# Patient Record
Sex: Female | Born: 1960 | Race: White | Hispanic: No | Marital: Married | Smoking: Current every day smoker
Health system: Southern US, Community
[De-identification: ages and names within clinical notes are randomized; demographics above are authoritative.]

## PROBLEM LIST (undated history)

## (undated) DIAGNOSIS — R51 Headache: Secondary | ICD-10-CM

## (undated) DIAGNOSIS — M797 Fibromyalgia: Secondary | ICD-10-CM

## (undated) DIAGNOSIS — C801 Malignant (primary) neoplasm, unspecified: Secondary | ICD-10-CM

## (undated) DIAGNOSIS — M130 Polyarthritis, unspecified: Secondary | ICD-10-CM

## (undated) DIAGNOSIS — N200 Calculus of kidney: Secondary | ICD-10-CM

## (undated) DIAGNOSIS — I10 Essential (primary) hypertension: Secondary | ICD-10-CM

## (undated) DIAGNOSIS — L930 Discoid lupus erythematosus: Secondary | ICD-10-CM

## (undated) HISTORY — PX: OTHER SURGICAL HISTORY: SHX169

## (undated) HISTORY — PX: EYE SURGERY: SHX253

## (undated) HISTORY — DX: Headache: R51

## (undated) HISTORY — DX: Polyarthritis, unspecified: M13.0

## (undated) HISTORY — PX: CHOLECYSTECTOMY: SHX55

## (undated) HISTORY — DX: Discoid lupus erythematosus: L93.0

## (undated) HISTORY — PX: NECK SURGERY: SHX720

## (undated) HISTORY — PX: APPENDECTOMY: SHX54

## (undated) HISTORY — PX: ABDOMINAL HYSTERECTOMY: SHX81

## (undated) HISTORY — DX: Fibromyalgia: M79.7

---

## 1999-01-30 ENCOUNTER — Inpatient Hospital Stay (HOSPITAL_COMMUNITY): Admission: EM | Admit: 1999-01-30 | Discharge: 1999-01-31 | Payer: Self-pay | Admitting: Emergency Medicine

## 1999-01-31 ENCOUNTER — Encounter: Payer: Self-pay | Admitting: Cardiology

## 2001-03-26 ENCOUNTER — Encounter (INDEPENDENT_AMBULATORY_CARE_PROVIDER_SITE_OTHER): Payer: Self-pay | Admitting: Internal Medicine

## 2001-03-26 ENCOUNTER — Ambulatory Visit (HOSPITAL_COMMUNITY): Admission: RE | Admit: 2001-03-26 | Discharge: 2001-03-26 | Payer: Self-pay | Admitting: Internal Medicine

## 2002-02-08 ENCOUNTER — Encounter: Payer: Self-pay | Admitting: Family Medicine

## 2002-02-08 ENCOUNTER — Ambulatory Visit (HOSPITAL_COMMUNITY): Admission: RE | Admit: 2002-02-08 | Discharge: 2002-02-08 | Payer: Self-pay | Admitting: Family Medicine

## 2002-04-29 ENCOUNTER — Ambulatory Visit (HOSPITAL_COMMUNITY): Admission: RE | Admit: 2002-04-29 | Discharge: 2002-04-29 | Payer: Self-pay | Admitting: Internal Medicine

## 2002-05-22 ENCOUNTER — Emergency Department (HOSPITAL_COMMUNITY): Admission: EM | Admit: 2002-05-22 | Discharge: 2002-05-23 | Payer: Self-pay | Admitting: Emergency Medicine

## 2002-05-23 ENCOUNTER — Encounter: Payer: Self-pay | Admitting: Emergency Medicine

## 2002-05-30 ENCOUNTER — Ambulatory Visit (HOSPITAL_COMMUNITY): Admission: RE | Admit: 2002-05-30 | Discharge: 2002-05-30 | Payer: Self-pay | Admitting: Internal Medicine

## 2002-05-30 ENCOUNTER — Encounter (INDEPENDENT_AMBULATORY_CARE_PROVIDER_SITE_OTHER): Payer: Self-pay | Admitting: Internal Medicine

## 2002-07-08 ENCOUNTER — Ambulatory Visit (HOSPITAL_COMMUNITY): Admission: RE | Admit: 2002-07-08 | Discharge: 2002-07-08 | Payer: Self-pay | Admitting: Family Medicine

## 2002-07-08 ENCOUNTER — Encounter: Payer: Self-pay | Admitting: Family Medicine

## 2002-09-02 ENCOUNTER — Encounter: Payer: Self-pay | Admitting: Emergency Medicine

## 2002-09-02 ENCOUNTER — Emergency Department (HOSPITAL_COMMUNITY): Admission: EM | Admit: 2002-09-02 | Discharge: 2002-09-02 | Payer: Self-pay | Admitting: Emergency Medicine

## 2003-02-07 ENCOUNTER — Ambulatory Visit (HOSPITAL_COMMUNITY): Admission: RE | Admit: 2003-02-07 | Discharge: 2003-02-07 | Payer: Self-pay | Admitting: Family Medicine

## 2003-02-07 ENCOUNTER — Encounter: Payer: Self-pay | Admitting: Family Medicine

## 2003-04-24 ENCOUNTER — Ambulatory Visit (HOSPITAL_COMMUNITY): Admission: RE | Admit: 2003-04-24 | Discharge: 2003-04-24 | Payer: Self-pay | Admitting: *Deleted

## 2003-04-26 ENCOUNTER — Encounter (HOSPITAL_COMMUNITY): Admission: RE | Admit: 2003-04-26 | Discharge: 2003-05-26 | Payer: Self-pay | Admitting: Cardiology

## 2003-04-26 ENCOUNTER — Encounter: Payer: Self-pay | Admitting: Cardiology

## 2004-07-06 ENCOUNTER — Emergency Department (HOSPITAL_COMMUNITY): Admission: EM | Admit: 2004-07-06 | Discharge: 2004-07-07 | Payer: Self-pay | Admitting: Emergency Medicine

## 2004-08-29 ENCOUNTER — Ambulatory Visit (HOSPITAL_COMMUNITY): Admission: RE | Admit: 2004-08-29 | Discharge: 2004-08-29 | Payer: Self-pay | Admitting: Family Medicine

## 2004-12-04 ENCOUNTER — Ambulatory Visit: Payer: Self-pay | Admitting: Psychology

## 2004-12-18 ENCOUNTER — Ambulatory Visit (HOSPITAL_COMMUNITY): Admission: RE | Admit: 2004-12-18 | Discharge: 2004-12-18 | Payer: Self-pay | Admitting: Family Medicine

## 2005-01-30 ENCOUNTER — Ambulatory Visit: Payer: Self-pay | Admitting: Psychology

## 2005-01-31 ENCOUNTER — Ambulatory Visit (HOSPITAL_BASED_OUTPATIENT_CLINIC_OR_DEPARTMENT_OTHER): Admission: RE | Admit: 2005-01-31 | Discharge: 2005-01-31 | Payer: Self-pay | Admitting: Ophthalmology

## 2005-01-31 ENCOUNTER — Ambulatory Visit (HOSPITAL_COMMUNITY): Admission: RE | Admit: 2005-01-31 | Discharge: 2005-01-31 | Payer: Self-pay | Admitting: Ophthalmology

## 2005-04-11 ENCOUNTER — Ambulatory Visit (HOSPITAL_COMMUNITY): Admission: RE | Admit: 2005-04-11 | Discharge: 2005-04-11 | Payer: Self-pay | Admitting: Family Medicine

## 2005-05-05 ENCOUNTER — Ambulatory Visit: Payer: Self-pay | Admitting: Psychology

## 2005-05-06 ENCOUNTER — Ambulatory Visit: Payer: Self-pay | Admitting: Gastroenterology

## 2005-05-12 ENCOUNTER — Ambulatory Visit: Payer: Self-pay | Admitting: Gastroenterology

## 2005-05-12 ENCOUNTER — Encounter (INDEPENDENT_AMBULATORY_CARE_PROVIDER_SITE_OTHER): Payer: Self-pay | Admitting: Specialist

## 2005-05-15 ENCOUNTER — Ambulatory Visit (HOSPITAL_COMMUNITY): Admission: RE | Admit: 2005-05-15 | Discharge: 2005-05-15 | Payer: Self-pay | Admitting: Gastroenterology

## 2005-05-22 ENCOUNTER — Ambulatory Visit: Payer: Self-pay | Admitting: Gastroenterology

## 2005-05-28 ENCOUNTER — Ambulatory Visit (HOSPITAL_COMMUNITY): Admission: RE | Admit: 2005-05-28 | Discharge: 2005-05-28 | Payer: Self-pay | Admitting: Family Medicine

## 2005-06-24 ENCOUNTER — Ambulatory Visit: Payer: Self-pay | Admitting: Gastroenterology

## 2005-08-14 ENCOUNTER — Ambulatory Visit: Payer: Self-pay | Admitting: Psychology

## 2005-10-02 ENCOUNTER — Ambulatory Visit (HOSPITAL_COMMUNITY): Admission: RE | Admit: 2005-10-02 | Discharge: 2005-10-02 | Payer: Self-pay | Admitting: *Deleted

## 2006-09-11 ENCOUNTER — Ambulatory Visit: Admission: RE | Admit: 2006-09-11 | Discharge: 2006-09-11 | Payer: Self-pay | Admitting: Family Medicine

## 2006-10-01 ENCOUNTER — Ambulatory Visit: Payer: Self-pay | Admitting: Pulmonary Disease

## 2006-12-11 ENCOUNTER — Ambulatory Visit (HOSPITAL_COMMUNITY): Admission: RE | Admit: 2006-12-11 | Discharge: 2006-12-11 | Payer: Self-pay | Admitting: Family Medicine

## 2008-06-27 ENCOUNTER — Encounter: Payer: Self-pay | Admitting: Gastroenterology

## 2008-06-29 ENCOUNTER — Ambulatory Visit (HOSPITAL_COMMUNITY): Admission: RE | Admit: 2008-06-29 | Discharge: 2008-06-29 | Payer: Self-pay | Admitting: Family Medicine

## 2008-08-14 DIAGNOSIS — Z87442 Personal history of urinary calculi: Secondary | ICD-10-CM | POA: Insufficient documentation

## 2008-08-14 DIAGNOSIS — I10 Essential (primary) hypertension: Secondary | ICD-10-CM | POA: Insufficient documentation

## 2008-08-14 DIAGNOSIS — F329 Major depressive disorder, single episode, unspecified: Secondary | ICD-10-CM | POA: Insufficient documentation

## 2008-08-14 DIAGNOSIS — R519 Headache, unspecified: Secondary | ICD-10-CM | POA: Insufficient documentation

## 2008-08-14 DIAGNOSIS — Z860100 Personal history of colon polyps, unspecified: Secondary | ICD-10-CM | POA: Insufficient documentation

## 2008-08-14 DIAGNOSIS — F411 Generalized anxiety disorder: Secondary | ICD-10-CM | POA: Insufficient documentation

## 2008-08-14 DIAGNOSIS — R197 Diarrhea, unspecified: Secondary | ICD-10-CM | POA: Insufficient documentation

## 2008-08-14 DIAGNOSIS — R51 Headache: Secondary | ICD-10-CM | POA: Insufficient documentation

## 2008-08-14 DIAGNOSIS — M129 Arthropathy, unspecified: Secondary | ICD-10-CM | POA: Insufficient documentation

## 2008-08-15 ENCOUNTER — Ambulatory Visit: Payer: Self-pay | Admitting: Gastroenterology

## 2008-08-15 DIAGNOSIS — K219 Gastro-esophageal reflux disease without esophagitis: Secondary | ICD-10-CM | POA: Insufficient documentation

## 2008-08-15 DIAGNOSIS — L930 Discoid lupus erythematosus: Secondary | ICD-10-CM

## 2008-08-16 ENCOUNTER — Telehealth: Payer: Self-pay | Admitting: Gastroenterology

## 2008-08-21 ENCOUNTER — Encounter: Payer: Self-pay | Admitting: Gastroenterology

## 2008-08-21 ENCOUNTER — Ambulatory Visit: Payer: Self-pay | Admitting: Gastroenterology

## 2008-08-21 LAB — CONVERTED CEMR LAB
AST: 27 units/L (ref 0–37)
Albumin: 4.1 g/dL (ref 3.5–5.2)
Basophils Relative: 0 % (ref 0.0–3.0)
Bilirubin Urine: NEGATIVE
Bilirubin, Direct: 0.1 mg/dL (ref 0.0–0.3)
Calcium: 9.1 mg/dL (ref 8.4–10.5)
Crystals: NEGATIVE
Eosinophils Absolute: 0.2 10*3/uL (ref 0.0–0.7)
Eosinophils Relative: 2.3 % (ref 0.0–5.0)
Ferritin: 144.4 ng/mL (ref 10.0–291.0)
Folate: >20 ng/mL
Hemoglobin, Urine: NEGATIVE
Ketones, ur: 15 mg/dL — AB
Leukocytes, UA: NEGATIVE
MCV: 93.1 fL (ref 78.0–100.0)
Mucus, UA: NEGATIVE
Neutrophils Relative %: 63.2 % (ref 43.0–77.0)
Platelets: 172 10*3/uL (ref 150–400)
Potassium: 4.2 meq/L (ref 3.5–5.1)
Saturation Ratios: 21.4 % (ref 20.0–50.0)
Sodium: 147 meq/L — ABNORMAL HIGH (ref 135–145)
Specific Gravity, Urine: >=1.03 (ref 1.000–1.03)
TSH: 0.81 microintl units/mL (ref 0.35–5.50)
Total Bilirubin: 1 mg/dL (ref 0.3–1.2)
Urine Glucose: NEGATIVE mg/dL
Urobilinogen, UA: 0.2 (ref 0.0–1.0)
WBC: 8.2 10*3/uL (ref 4.5–10.5)

## 2008-08-22 ENCOUNTER — Telehealth: Payer: Self-pay | Admitting: Gastroenterology

## 2008-08-23 ENCOUNTER — Encounter: Payer: Self-pay | Admitting: Gastroenterology

## 2008-08-25 ENCOUNTER — Ambulatory Visit: Payer: Self-pay | Admitting: Gastroenterology

## 2008-08-25 LAB — CONVERTED CEMR LAB: UREASE: NEGATIVE

## 2008-09-19 ENCOUNTER — Ambulatory Visit: Payer: Self-pay | Admitting: Gastroenterology

## 2008-09-20 ENCOUNTER — Telehealth: Payer: Self-pay | Admitting: Gastroenterology

## 2009-12-03 ENCOUNTER — Emergency Department (HOSPITAL_COMMUNITY): Admission: EM | Admit: 2009-12-03 | Discharge: 2009-12-03 | Payer: Self-pay | Admitting: Emergency Medicine

## 2010-10-08 ENCOUNTER — Emergency Department (HOSPITAL_COMMUNITY)
Admission: EM | Admit: 2010-10-08 | Discharge: 2010-10-09 | Payer: Self-pay | Source: Home / Self Care | Admitting: Emergency Medicine

## 2010-11-01 ENCOUNTER — Ambulatory Visit (HOSPITAL_COMMUNITY)
Admission: RE | Admit: 2010-11-01 | Discharge: 2010-11-01 | Payer: Self-pay | Source: Home / Self Care | Attending: Family Medicine | Admitting: Family Medicine

## 2010-11-03 ENCOUNTER — Encounter: Payer: Self-pay | Admitting: Family Medicine

## 2010-11-08 ENCOUNTER — Ambulatory Visit (HOSPITAL_COMMUNITY)
Admission: RE | Admit: 2010-11-08 | Discharge: 2010-11-08 | Payer: Self-pay | Source: Home / Self Care | Attending: Family Medicine | Admitting: Family Medicine

## 2010-11-18 ENCOUNTER — Other Ambulatory Visit (HOSPITAL_COMMUNITY): Payer: Self-pay | Admitting: Family Medicine

## 2010-11-18 DIAGNOSIS — R599 Enlarged lymph nodes, unspecified: Secondary | ICD-10-CM

## 2010-11-25 ENCOUNTER — Ambulatory Visit (HOSPITAL_COMMUNITY)
Admission: RE | Admit: 2010-11-25 | Discharge: 2010-11-25 | Disposition: A | Discharge status: Home / Self Care | Payer: Medicare Other | Source: Ambulatory Visit | Attending: Family Medicine | Admitting: Family Medicine

## 2010-11-25 DIAGNOSIS — R599 Enlarged lymph nodes, unspecified: Secondary | ICD-10-CM | POA: Insufficient documentation

## 2010-11-25 MED ORDER — IOHEXOL 300 MG/ML  SOLN
75.0000 mL | Freq: Once | INTRAMUSCULAR | Status: AC | PRN
  Administered 2010-11-25: 75 mL via INTRAVENOUS

## 2010-12-09 ENCOUNTER — Other Ambulatory Visit: Payer: Self-pay | Admitting: Neurosurgery

## 2010-12-09 DIAGNOSIS — M47812 Spondylosis without myelopathy or radiculopathy, cervical region: Secondary | ICD-10-CM

## 2010-12-10 ENCOUNTER — Ambulatory Visit
Admission: RE | Admit: 2010-12-10 | Discharge: 2010-12-10 | Disposition: A | Discharge status: Home / Self Care | Payer: Medicare Other | Source: Ambulatory Visit | Attending: Neurosurgery | Admitting: Neurosurgery

## 2010-12-10 DIAGNOSIS — M47812 Spondylosis without myelopathy or radiculopathy, cervical region: Secondary | ICD-10-CM

## 2010-12-12 ENCOUNTER — Ambulatory Visit (INDEPENDENT_AMBULATORY_CARE_PROVIDER_SITE_OTHER): Payer: Medicare Other | Admitting: Otolaryngology

## 2010-12-12 DIAGNOSIS — D487 Neoplasm of uncertain behavior of other specified sites: Secondary | ICD-10-CM

## 2010-12-12 DIAGNOSIS — R220 Localized swelling, mass and lump, head: Secondary | ICD-10-CM

## 2011-01-01 LAB — URINALYSIS, ROUTINE W REFLEX MICROSCOPIC
Hgb urine dipstick: NEGATIVE
Ketones, ur: NEGATIVE mg/dL
Nitrite: NEGATIVE
Specific Gravity, Urine: >1.03 — ABNORMAL HIGH (ref 1.005–1.030)

## 2011-01-01 LAB — COMPREHENSIVE METABOLIC PANEL
ALT: 23 U/L (ref 0–35)
Alkaline Phosphatase: 98 U/L (ref 39–117)
BUN: 8 mg/dL (ref 6–23)
CO2: 24 mEq/L (ref 19–32)
Chloride: 105 mEq/L (ref 96–112)
Creatinine, Ser: 0.57 mg/dL (ref 0.4–1.2)
Potassium: 3.7 mEq/L (ref 3.5–5.1)
Sodium: 139 mEq/L (ref 135–145)
Total Bilirubin: 0.4 mg/dL (ref 0.3–1.2)

## 2011-01-01 LAB — CBC
Hemoglobin: 14.7 g/dL (ref 12.0–15.0)
MCHC: 34 g/dL (ref 30.0–36.0)

## 2011-01-01 LAB — DIFFERENTIAL
Basophils Absolute: 0 10*3/uL (ref 0.0–0.1)
Eosinophils Relative: 1 % (ref 0–5)
Lymphocytes Relative: 18 % (ref 12–46)
Monocytes Absolute: 1 10*3/uL (ref 0.1–1.0)
Monocytes Relative: 7 % (ref 3–12)
Neutrophils Relative %: 74 % (ref 43–77)

## 2011-01-17 IMAGING — CR DG CHEST 2V
2 series · 2 of 2 positions shown · non-contrast
Comparison: 10/02/2005

CLINICAL DATA: Question of meningitis.  Headache, fever, neck pain
since last night.

CHEST - 2 VIEW

[view not recorded (1 of 2)]
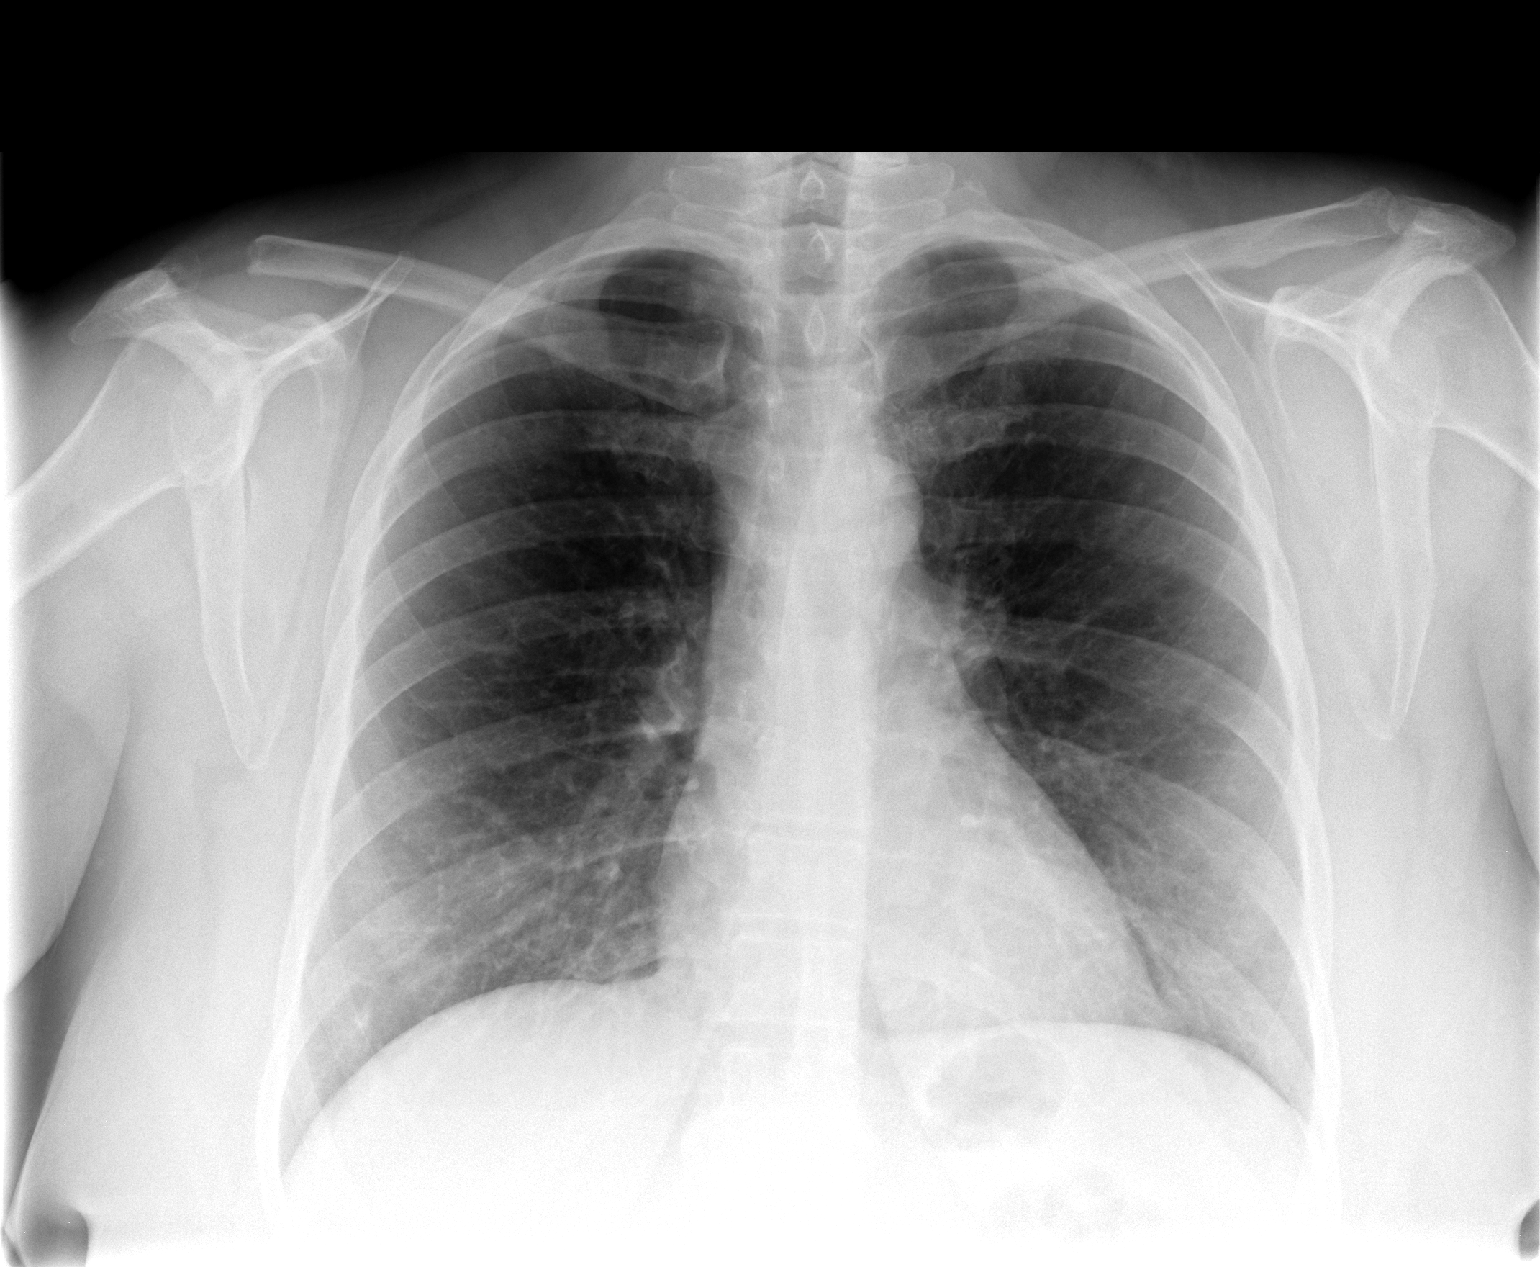

[view not recorded (2 of 2)]
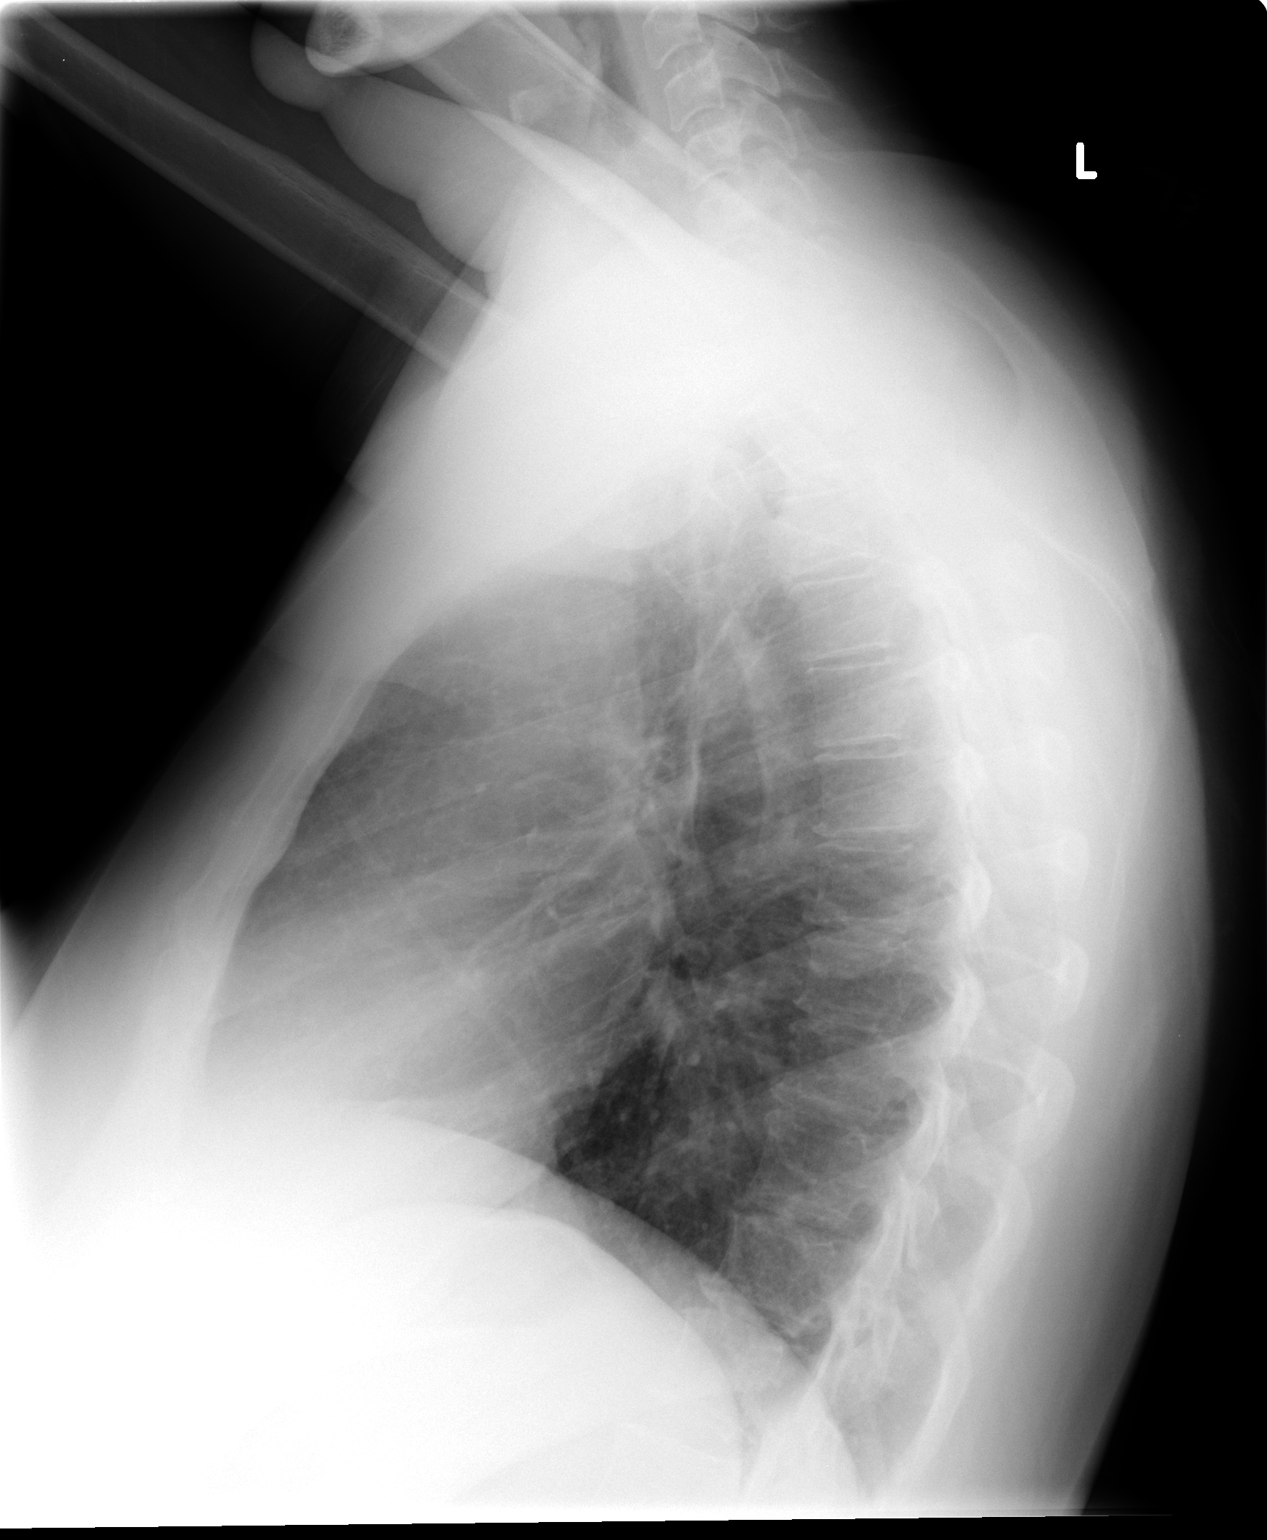

[2 of 2 positions shown; findings below may reference images not displayed]

FINDINGS: Cardiac size is within normal limits.  No focal
consolidations or pleural effusions.  No pulmonary edema.  Mildly
prominent interstitial markings appears stable.  Degenerative
changes are seen in the spine. Patient has had prior resection
arthroplasty of the right acromioclavicular joint.
IMPRESSION: No evidence for acute  abnormality.

## 2011-02-28 NOTE — Op Note (Signed)
NAMEWILLINE, SCHWALBE                ACCOUNT NO.:  [id number]   MEDICAL RECORD NO.:  [id number]          PATIENT TYPE:  AMB   LOCATION:  DSC                          FACILITY:  [facility]   PHYSICIAN:  Pasty Spillers. Maple Hudson, M.D. DATE OF BIRTH:  01/09/1961   DATE OF PROCEDURE:  01/31/2005  DATE OF DISCHARGE:                                 OPERATIVE REPORT   PREOPERATIVE DIAGNOSES:  1.  V-pattern exotropia.  2.  Nystagmus.  3.  Inferior oblique overaction, right greater than left.  4.  Previous surgery in childhood for esotropia, details unknown.   POSTOPERATIVE DIAGNOSES:  1.  V-pattern exotropia.  2.  Nystagmus.  3.  Inferior oblique overaction, right greater than left.  4.  Previous surgery in childhood for esotropia, details unknown.   PROCEDURE:  1.  Explore/advance right medial rectus muscle, 5.5 mm.  2.  Right lateral rectus muscle recession, 6.0 mm.  3.  Right inferior oblique muscle anterior transposition.  4.  Left inferior oblique muscle recession.   SURGEON:  Pasty Spillers. Maple Hudson, M.D.   ANESTHESIA:  General (laryngeal mask).   COMPLICATIONS:  None.   DESCRIPTION OF PROCEDURE:  After routine preop evaluation including informed  consent, the patient was taken to the operating room where she was  identified by me.  General anesthesia was induced without difficulty after  placement of appropriate monitors.  The patient was prepped and draped in  standard sterile fashion.  A lid speculum placed in the right eye.   Through an inferonasal fornix incision through conjunctiva and Tenon's  fascia, the right medial rectus muscle was engaged on a series of muscle  hooks. Relatively mild scar tissue was encountered in the isolation of  muscle.  The muscle was cleared of its surrounding fascial attachments and  scar tissue.  Its tendon was secured with a double-arm 6-0 Vicryl suture,  with a double-locking bite at each border of the muscle, approximately 1 mm  from the current  insertion.  The muscle was disinserted.  Our attention was  then directed to the lateral rectus muscle, which was identified and engaged  on a series of muscle hooks by an inferotemporal fornix incision.  The  muscle appeared not to have been previously operated.  A traction suture was  passed under the muscle with Gass hook, and this was used to draw the eye up  and in.  Using 2 muscle hooks through the conjunctival incision for  exposure, the right inferior oblique muscle was identified and engaged on an  oblique hook.  It was cleared of its fascial attachments all the way to its  insertion, which was secured with a fine curved hemostat.  The muscle was  disinserted.  Its cut end was secured with a double-arm 6-0 Vicryl suture,  with a double-locking bite at each border of the muscle.  The right inferior  rectus muscle was engaged on a series of muscle hooks.  The inferior oblique  was reattached to sclera adjacent to and at the level of the temporal border  of the inferior rectus muscle insertion, using direct  scleral passes in  crossed-swords fashion.  The lateral rectus muscle was again engaged on a  series of muscle hooks, and the traction suture was removed.  The lateral  rectus was cleared of its fascial attachments.  The tendon was secured with  a double-arm 6-0 Vicryl suture, with a double-locking bite at each border of  the muscle, 1 mm from the insertion.  The muscle was disinserted, it was  reattached to sclera at a measured distance of 6.0 mm posterior to the  original insertion, using direct scleral passes in crossed-swords fashion.  The sutures ends were tied securely after the position of the muscle been  checked and found to be accurate.  Conjunctiva was closed with three 6-0  plain gut sutures.  The right medial rectus muscle was then advanced to a  position 5.0 mm posterior to the limbus.  Note that when it was disinserted,  it was found to be inserted 10.5 mm posterior  to the limbus, so that the  advancement was 5.5 mm. The muscle was reattached to sclera using direct  scleral passes in crossed-swords fashion.  The suture ends were tied  securely.  Conjunctiva was closed with two 6-0 plain gut sutures.  The lid  speculums was transferred to the left eye, where the left inferior oblique  muscle was identified, isolated, and secured with suture via an  inferotemporal fornix incision, as described for the right inferior oblique.  The right inferior rectus was engaged on a series of muscle hooks.  A mark  was made on the sclera 3 mm posterior and 3 mm temporal to the temporal  border of the inferior rectus insertion.  This was used at the exit point  for the pole sutures of the inferior oblique, which were passed in crossed-  swords fashion and tied securely.  Conjunctiva was closed with two 6-0 plain  gut sutures.  TobraDex ointment was placed in each eye.  The patient was  awakened without difficulty and taken to recovery in stable condition,  having suffered no intraoperative or immediate postop complications.      WOY/MEDQ  D:  01/31/2005  T:  02/01/2005  Job:  [id number]

## 2011-02-28 NOTE — Op Note (Signed)
[facility]  Patient:    Julia [name], Julia [name] Visit Number: [id number] MRN: [id number]          Service Type: END Location: DAY Attending Physician:  Malissa Hippo Dictated by:   Lionel December, M.D. Proc. Date: 04/29/02 Admit Date:  04/29/2002 Discharge Date: 04/29/2002                             Operative Report  PROCEDURE:  Total colonoscopy.  ENDOSCOPIST:  Lionel December, M.D.  INDICATIONS:  The patient is a 50 year old Caucasian female with chronic diarrhea with intermittent bleeding.  Her stool studies have been negative. She has a history of nonspecific colitis.  Her last colonoscopy was in October or November 1998.  She had a small adenoma removed from her rectum and a few erosions in her sigmoid colon were biopsied and not consistent with inflammatory bowel disease.  I felt that she may have IBS.  Recently, her stool studies have been negative.  This time, she has had diarrhea for about four months.  She is undergoing diagnostic colonoscopy.  Since she had an adenoma removed from her rectum, she also needs this exam to make sure she does not have recurrent polyps.  The procedure is reviewed with the patient and informed consent is obtained.  PREOPERATIVE MEDICATIONS:  Demerol 50 mg IV, Versed 7 mg IV in divided dose.  INSTRUMENT:  Olympus video system.  DESCRIPTION OF PROCEDURE:  The procedure was performed in the endoscopy suite. The patients vital signs and oxygen saturation were monitored during the procedure and remained stable.  The patient was placed in the left lateral position and rectal examination was performed.  This was within normal limits. The scope was placed in the rectum and advanced under vision to the sigmoid colon and beyond.  Preparation was satisfactory.  The scope was passed to the cecum which was identified by ileocecal valve and appendiceal valve.  As the scope was withdrawn, the colonic mucosa was carefully  examined and was normal. Random biopsies were taken from the sigmoid colon.  Rectal mucosa was normal. The scope was retroflexed and examined in the anorectal junction.  She had small hemorrhoids below the dentate line.  The endoscope was straightened and withdrawn.  I was not able to examine her TI.  The patient tolerated the procedure well.  FINAL DIAGNOSIS: 1. Normal colonoscopy except small external hemorrhoids.  Random biopsies    taken from the sigmoid colon. 2. Terminal ileum could not be examined.  RECOMMENDATIONS:  Will treat her with Levbid one tablet every morning and Benefiber 4 g q.d.  If biopsies are negative and she does not respond to therapy we will do a small bowel follow through. Dictated by:   Lionel December, M.D. Attending Physician:  Malissa Hippo DD:  04/29/02 TD:  05/04/02 Job: [id number]

## 2011-02-28 NOTE — Procedures (Signed)
   NAME:  Julia [name], Julia [name]                          ACCOUNT NO.:  [id number]   MEDICAL RECORD NO.:  [id number]                   PATIENT TYPE:  OUT   LOCATION:  RAD                                  FACILITY:  [facility]   PHYSICIAN:  Vida Roller, M.D.                DATE OF BIRTH:  [birth year]   DATE OF PROCEDURE:  04/24/2003  DATE OF DISCHARGE:                                  ECHOCARDIOGRAM   REFERRING PHYSICIAN:  Scott A. Gerda Diss, M.D.   TAPE #:  [id number].   TAPE COUNT:  [id number].   REASON FOR CONSULTATION:  This is a 50 year old woman with an abnormal EKG.  The technical quality of the study is adequate.   M-MODE MEASUREMENTS:  The aorta is 29 mm.   The left atrium is 45 mm.   The septum is 11 mm.   The left ______ posterior wall is 9 mm.   Left ventricular diastolic dimension is 50 mm.   The ventricular systolic dimension is 36 mm.   2-D AND DOPPLER IMAGING:  The left ventricle is normal sized with normal  systolic function.  There are no wall motion abnormalities seen.  Diastolic  function appears to be preserved.   The right ventricle size and function are normal.  There are no wall motion  abnormalities.   Both atria appear to be normal sized.  The left is slightly larger than the  right.   The aortic valve is trileaflet, tricommissural with no evidence of stenosis  or regurgitation.   The mitral valve is morphologically unremarkable with trace insufficiency.  No stenosis is seen.   The tricuspid valve is morphologically unremarkable with no stenosis or  regurgitation.   The pulmonic valve is not well seen.   The pericardial structures appear normal.   The ascending aorta was not well seen.   The inferior vena cava appeared to be normal sized.                                               Vida Roller, M.D.    JH/MEDQ  D:  04/24/2003  T:  04/25/2003  Job:  [id number]

## 2011-02-28 NOTE — Procedures (Signed)
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  Epworth score:  15.   SLEEP ARCHITECTURE:  The patient had a total sleep time of 336 minutes  with adequate slow wave sleep for her age but decreased REM.  Sleep  onset latency was normal and REM onset was normal, as well.  Sleep  efficiency was mildly decreased at 87%.   RESPIRATORY DATA:  The patient was found to have four obstructive apneas  and one central apnea for a respiratory disturbance index of 0.9 per  hour.  Split night protocol was not done secondary to the small numbers  of obstructive events.  Mild snoring was noted throughout.   OXYGEN DATA:  The patient had O2 desaturation as low as 89% that was  transient in nature and related to her few events.   CARDIAC DATA:  No clinically significant cardiac arrhythmias.   MOVEMENT/PARASOMNIAS:  The patient was found to have 382 leg jerks with  13 per hour resulting in arousal or awakening.   IMPRESSION/RECOMMENDATION:  1. Small numbers of obstructive events which do not meet the RDI      criteria for the obstructive sleep apnea syndrome.  2. Very large numbers of leg jerks with significant sleep disruption.      I suspect this is the etiology for the patient's daytime      symptomatology.  Clinical correlation is suggested to evaluate for      the restless leg syndrome verses the periodic leg movement      syndrome.      Barbaraann Share, MD,FCCP  Diplomate, American Board of Sleep  Medicine     [facility]/MEDQ  D:  09/25/2006 14:24:02  T:  09/25/2006 17:16:48  Job:  [id number]

## 2011-04-24 ENCOUNTER — Ambulatory Visit (INDEPENDENT_AMBULATORY_CARE_PROVIDER_SITE_OTHER): Payer: Medicare Other | Admitting: Otolaryngology

## 2011-04-24 DIAGNOSIS — D487 Neoplasm of uncertain behavior of other specified sites: Secondary | ICD-10-CM

## 2011-05-29 ENCOUNTER — Ambulatory Visit (INDEPENDENT_AMBULATORY_CARE_PROVIDER_SITE_OTHER): Payer: Medicare Other | Admitting: Otolaryngology

## 2011-05-29 DIAGNOSIS — H6120 Impacted cerumen, unspecified ear: Secondary | ICD-10-CM

## 2011-05-29 DIAGNOSIS — D487 Neoplasm of uncertain behavior of other specified sites: Secondary | ICD-10-CM

## 2012-01-06 ENCOUNTER — Other Ambulatory Visit: Payer: Self-pay | Admitting: Neurosurgery

## 2012-01-06 DIAGNOSIS — M47812 Spondylosis without myelopathy or radiculopathy, cervical region: Secondary | ICD-10-CM

## 2012-01-21 ENCOUNTER — Ambulatory Visit
Admission: RE | Admit: 2012-01-21 | Discharge: 2012-01-21 | Disposition: A | Discharge status: Home / Self Care | Payer: Medicare Other | Source: Ambulatory Visit | Attending: Neurosurgery | Admitting: Neurosurgery

## 2012-01-21 DIAGNOSIS — M47812 Spondylosis without myelopathy or radiculopathy, cervical region: Secondary | ICD-10-CM

## 2012-03-12 ENCOUNTER — Other Ambulatory Visit: Payer: Self-pay | Admitting: Urology

## 2012-03-12 ENCOUNTER — Ambulatory Visit (INDEPENDENT_AMBULATORY_CARE_PROVIDER_SITE_OTHER): Payer: Medicare Other | Admitting: Urology

## 2012-03-12 DIAGNOSIS — R310 Gross hematuria: Secondary | ICD-10-CM

## 2012-03-12 DIAGNOSIS — Z87442 Personal history of urinary calculi: Secondary | ICD-10-CM

## 2012-03-18 ENCOUNTER — Encounter (HOSPITAL_COMMUNITY): Payer: Self-pay

## 2012-03-18 ENCOUNTER — Ambulatory Visit (HOSPITAL_COMMUNITY)
Admission: RE | Admit: 2012-03-18 | Discharge: 2012-03-18 | Disposition: A | Discharge status: Home / Self Care | Payer: Medicare Other | Source: Ambulatory Visit | Attending: Urology | Admitting: Urology

## 2012-03-18 DIAGNOSIS — R310 Gross hematuria: Secondary | ICD-10-CM | POA: Insufficient documentation

## 2012-03-18 HISTORY — DX: Malignant (primary) neoplasm, unspecified: C80.1

## 2012-03-18 HISTORY — DX: Calculus of kidney: N20.0

## 2012-03-18 HISTORY — DX: Essential (primary) hypertension: I10

## 2012-03-18 MED ORDER — IOHEXOL 300 MG/ML  SOLN
125.0000 mL | Freq: Once | INTRAMUSCULAR | Status: AC | PRN
  Administered 2012-03-18: 125 mL via INTRAVENOUS

## 2012-05-14 ENCOUNTER — Ambulatory Visit (INDEPENDENT_AMBULATORY_CARE_PROVIDER_SITE_OTHER): Payer: Medicare Other | Admitting: Urology

## 2012-05-14 DIAGNOSIS — R310 Gross hematuria: Secondary | ICD-10-CM

## 2012-06-09 ENCOUNTER — Emergency Department (HOSPITAL_COMMUNITY)
Admission: EM | Admit: 2012-06-09 | Discharge: 2012-06-10 | Disposition: A | Discharge status: Other Acute Inpatient Hospital | Payer: Medicare Other | Attending: Emergency Medicine | Admitting: Emergency Medicine

## 2012-06-09 ENCOUNTER — Emergency Department (HOSPITAL_COMMUNITY): Payer: Medicare Other

## 2012-06-09 ENCOUNTER — Encounter (HOSPITAL_COMMUNITY): Payer: Self-pay

## 2012-06-09 DIAGNOSIS — M542 Cervicalgia: Secondary | ICD-10-CM | POA: Insufficient documentation

## 2012-06-09 DIAGNOSIS — H53149 Visual discomfort, unspecified: Secondary | ICD-10-CM | POA: Insufficient documentation

## 2012-06-09 DIAGNOSIS — R509 Fever, unspecified: Secondary | ICD-10-CM | POA: Insufficient documentation

## 2012-06-09 DIAGNOSIS — Z981 Arthrodesis status: Secondary | ICD-10-CM | POA: Insufficient documentation

## 2012-06-09 DIAGNOSIS — I10 Essential (primary) hypertension: Secondary | ICD-10-CM | POA: Insufficient documentation

## 2012-06-09 DIAGNOSIS — L02219 Cutaneous abscess of trunk, unspecified: Secondary | ICD-10-CM | POA: Insufficient documentation

## 2012-06-09 DIAGNOSIS — L02212 Cutaneous abscess of back [any part, except buttock]: Secondary | ICD-10-CM

## 2012-06-09 LAB — CBC WITH DIFFERENTIAL/PLATELET
HCT: 40.1 % (ref 36.0–46.0)
Hemoglobin: 13.7 g/dL (ref 12.0–15.0)
Lymphocytes Relative: 8 % — ABNORMAL LOW (ref 12–46)
MCHC: 34.2 g/dL (ref 30.0–36.0)
MCV: 90.3 fL (ref 78.0–100.0)
Monocytes Absolute: 1.6 10*3/uL — ABNORMAL HIGH (ref 0.1–1.0)
Monocytes Relative: 7 % (ref 3–12)
Neutro Abs: 19 10*3/uL — ABNORMAL HIGH (ref 1.7–7.7)
WBC: 22.5 10*3/uL — ABNORMAL HIGH (ref 4.0–10.5)

## 2012-06-09 LAB — BASIC METABOLIC PANEL
BUN: 9 mg/dL (ref 6–23)
CO2: 24 mEq/L (ref 19–32)
Chloride: 99 mEq/L (ref 96–112)
Creatinine, Ser: 0.68 mg/dL (ref 0.50–1.10)

## 2012-06-09 MED ORDER — VANCOMYCIN HCL IN DEXTROSE 1-5 GM/200ML-% IV SOLN
1000.0000 mg | Freq: Once | INTRAVENOUS | Status: AC
  Administered 2012-06-10: 1000 mg via INTRAVENOUS
  Filled 2012-06-09: qty 200

## 2012-06-09 MED ORDER — HYDROMORPHONE HCL PF 1 MG/ML IJ SOLN
1.0000 mg | Freq: Once | INTRAMUSCULAR | Status: AC
  Administered 2012-06-09: 1 mg via INTRAVENOUS
  Filled 2012-06-09: qty 1

## 2012-06-09 MED ORDER — SODIUM CHLORIDE 0.9 % IV SOLN
INTRAVENOUS | Status: DC
  Administered 2012-06-09: 22:00:00 via INTRAVENOUS

## 2012-06-09 MED ORDER — IOHEXOL 300 MG/ML  SOLN
100.0000 mL | Freq: Once | INTRAMUSCULAR | Status: AC | PRN
  Administered 2012-06-09: 100 mL via INTRAVENOUS

## 2012-06-09 MED ORDER — ONDANSETRON HCL 4 MG/2ML IJ SOLN
4.0000 mg | Freq: Once | INTRAMUSCULAR | Status: AC
  Administered 2012-06-09: 4 mg via INTRAVENOUS
  Filled 2012-06-09: qty 2

## 2012-06-09 MED ORDER — PIPERACILLIN-TAZOBACTAM 3.375 G IVPB
3.3750 g | Freq: Once | INTRAVENOUS | Status: DC
  Administered 2012-06-09: 3.375 g via INTRAVENOUS
  Filled 2012-06-09: qty 50

## 2012-06-09 MED ORDER — SODIUM CHLORIDE 0.9 % IV BOLUS (SEPSIS)
1000.0000 mL | Freq: Once | INTRAVENOUS | Status: AC
  Administered 2012-06-09: 1000 mL via INTRAVENOUS

## 2012-06-09 NOTE — ED Provider Notes (Signed)
History   This chart was scribed for Julia Hutching, MD by Julia [name]. The patient was seen in room APA05/APA05 and the patient's care was started at 9:36PM.    CSN: [id number]  Arrival date & time 06/09/12  1939   First MD Initiated Contact with Patient 06/09/12 2055      Chief Complaint  Patient presents with  . Neck Pain  . Fever    (Consider location/radiation/quality/duration/timing/severity/associated sxs/prior treatment) The history is provided by the spouse and the patient (husband).   Julia [name] is a 51 y.o. female who presents to the Emergency Department complaining of constant, moderate to severe neck pain and fever with an onset today. Pt had a neck surgery, post fusion at C5, C6, C7, and T1 with a donor bone graft 2 weeks ago. Pt has not had any post-surgical complications until today when the present symptoms began. Pt talked to the surgeon's nurse practitioner this evening around 5:30PM who said that everything was fine. Pt's husband states that area of incision is more erythematous and swollen than it has ever been before. Clear drainage from the wound is present. Fever: 102.6 before exam. HA present. No sore throat, rash, back pain, CP, SOB, abd pain, n/v/d, dysuria, or extremity pain, edema, weakness, numbness, or tingling. No other pertinent medical symptoms.  Surgeon: Dr. Channing Mutters at [facility]  Past Medical History  Diagnosis Date  . Hypertension   . Cancer     skin  . Kidney stones     Past Surgical History  Procedure Date  . Abdominal hysterectomy   . Cholecystectomy   . Appendectomy   . Neck surgery     History reviewed. No pertinent family history.  History  Substance Use Topics  . Smoking status: Never Smoker   . Smokeless tobacco: Not on file  . Alcohol Use: No    OB History    Grav Para Term Preterm Abortions TAB SAB Ect Mult Living                  Review of Systems 10 Systems reviewed and all are negative for acute change except as  noted in the HPI.   Allergies  E-mycin and Erythromycin  Home Medications   Current Outpatient Rx  Name Route Sig Dispense Refill  . CALCIUM 600 + D PO Oral Take 1 tablet by mouth daily.    . CYCLOBENZAPRINE HCL 10 MG PO TABS Oral Take 10 mg by mouth every 6 (six) hours as needed. For pain    . HYDROCODONE-ACETAMINOPHEN 10-325 MG PO TABS Oral Take 1 tablet by mouth Three times daily as needed. For pain    . HYDROXYCHLOROQUINE SULFATE 200 MG PO TABS Oral Take 200 mg by mouth Daily.    Marland Kitchen LISINOPRIL 5 MG PO TABS Oral Take 5 mg by mouth daily.    . ADULT MULTIVITAMIN W/MINERALS CH Oral Take 1 tablet by mouth daily.    . OXYCODONE-ACETAMINOPHEN 5-325 MG PO TABS Oral Take 1-2 tablets by mouth daily as needed. For pain    . TRAZODONE HCL 100 MG PO TABS Oral Take 100 mg by mouth at bedtime as needed and may repeat dose one time if needed.    . VENTOLIN HFA 108 (90 BASE) MCG/ACT IN AERS Inhalation Inhale 1-2 puffs into the lungs Once daily as needed. For shortness of breath      BP 139/74  Pulse 92  Temp 102.6 F (39.2 C) (Oral)  Resp 22  Ht 5' 4.5" (  1.638 m)  Wt 214 lb (97.07 kg)  BMI 36.17 kg/m2  SpO2 97%  Physical Exam  Nursing note and vitals reviewed. Constitutional: She is oriented to person, place, and time. She appears well-developed and well-nourished.       Obese.  HENT:  Head: Normocephalic and atraumatic.  Eyes: EOM are normal.       Photophobic.  Neck: Normal range of motion. Neck supple.  Cardiovascular: Normal rate, normal heart sounds and intact distal pulses.   Pulmonary/Chest: Effort normal and breath sounds normal.  Abdominal: Bowel sounds are normal. She exhibits no distension. There is no tenderness.  Musculoskeletal: Normal range of motion. She exhibits no edema and no tenderness.  Neurological: She is alert and oriented to person, place, and time. She has normal strength. No cranial nerve deficit or sensory deficit.  Skin: Skin is warm and dry. No rash  noted.       Horizontal midline scar from the lower neck to the upper back about 7 cm in length that is erythematous and tender with fluctuance around the wound.  Psychiatric: She has a normal mood and affect.    ED Course  Procedures (including critical care time)  DIAGNOSTIC STUDIES: Oxygen Saturation is 97% on room air, normal by my interpretation.    COORDINATION OF CARE:  9:40PM - thoracic spine CT w/ contrast, soft tissue neck CT w/ contrast, blood w/u, and blood culture will be ordered for the pt.   Labs Reviewed - No data to display No results found.   No diagnosis found. Ct Soft Tissue Neck W Contrast  06/09/2012  *RADIOLOGY REPORT*  Clinical Data: Recent spine fusion, now with fever.  CT NECK WITH CONTRAST  Technique:  Multidetector CT imaging of the neck was performed with intravenous contrast.  Contrast: OMNIPAQUE IOHEXOL 300 MG/ML  SOLN  Comparison: 06/02/2012 radiograph  Findings: Postoperative changes status post anterior and posterior C6-T1 fusion.  There is a 7.3 x 6.2 cm fluid collection posterior to the operative levels, extending from the posterior elements of the vertebral bodies to the skin surface.  Involvement of the underlying bone/hardware or extension into the canal cannot be excluded on this examination.  Visualized intracranial contents within normal limits.  Lung apices are clear.  Unremarkable nasal cavity, nasopharynx, oral cavity, oropharynx, hypopharynx, larynx, epiglottis.  Patent airway.  Symmetric parotid and submandibular glands.  There are prominent cervical chain lymph nodes, left greater than right, nonspecific however may be reactive.  Due to the timing of the contrast bolus, cannot evaluate the vertebral arteries in their entirety. The vessels are patent or visualized.  IMPRESSION: Posterior C6-T1 fusion with a 7.3 x 6.2 cm fluid collection extending from the vertebral bodies/posterior hardware to the skin surface.  There is a rim enhancement,  therefore infection suggested, and underlying osseous/hardware involvement cannot be excluded. Recommend sampling the fluid.  Discussed via telephone with Dr. Adriana Simas at 11:05 p.m. on 06/09/2012.   Original Report Authenticated By: Waneta Martins, M.D.     Results for orders placed during the hospital encounter of 06/09/12  CBC WITH DIFFERENTIAL      Component Value Range   WBC 22.5 (*) 4.0 - 10.5 K/uL   RBC 4.44  3.87 - 5.11 MIL/uL   Hemoglobin 13.7  12.0 - 15.0 g/dL   HCT 40.9  81.1 - 91.4 %   MCV 90.3  78.0 - 100.0 fL   MCH 30.9  26.0 - 34.0 pg   MCHC 34.2  30.0 -  36.0 g/dL   RDW 04.5  40.9 - 81.1 %   Platelets 249  150 - 400 K/uL   Neutrophils Relative 84 (*) 43 - 77 %   Neutro Abs 19.0 (*) 1.7 - 7.7 K/uL   Lymphocytes Relative 8 (*) 12 - 46 %   Lymphs Abs 1.8  0.7 - 4.0 K/uL   Monocytes Relative 7  3 - 12 %   Monocytes Absolute 1.6 (*) 0.1 - 1.0 K/uL   Eosinophils Relative 1  0 - 5 %   Eosinophils Absolute 0.1  0.0 - 0.7 K/uL   Basophils Relative 0  0 - 1 %   Basophils Absolute 0.1  0.0 - 0.1 K/uL  BASIC METABOLIC PANEL      Component Value Range   Sodium 133 (*) 135 - 145 mEq/L   Potassium 4.1  3.5 - 5.1 mEq/L   Chloride 99  96 - 112 mEq/L   CO2 24  19 - 32 mEq/L   Glucose, Bld 118 (*) 70 - 99 mg/dL   BUN 9  6 - 23 mg/dL   Creatinine, Ser 9.14  0.50 - 1.10 mg/dL   Calcium 9.2  8.4 - 78.2 mg/dL   GFR calc non Af Amer >90  >90 mL/min   GFR calc Af Amer >90  >90 mL/min  CULTURE, BLOOD (ROUTINE X 2)      Component Value Range   Specimen Description BLOOD RIGHT HAND     Special Requests BOTTLES DRAWN AEROBIC AND ANAEROBIC 5CC EACH     Culture PENDING     Report Status PENDING    CULTURE, BLOOD (ROUTINE X 2)      Component Value Range   Specimen Description BLOOD LEFT ANTECUBITAL     Special Requests BOTTLES DRAWN AEROBIC AND ANAEROBIC 5CC EACH     Culture PENDING     Report Status PENDING    CRITICAL CARE Performed by: Julia [name]   Total critical care time:  40  Critical care time was exclusive of separately billable procedures and treating other patients.  Critical care was necessary to treat or prevent imminent or life-threatening deterioration.  Critical care was time spent personally by me on the following activities: development of treatment plan with patient and/or surrogate as well as nursing, discussions with consultants, evaluation of patient's response to treatment, examination of patient, obtaining history from patient or surrogate, ordering and performing treatments and interventions, ordering and review of laboratory studies, ordering and review of radiographic studies, pulse oximetry and re-evaluation of patient's condition.   MDM  CT scan of the thoracic spine shows a 6 x 7 cm fluid collection over the incision site.  IV vancomycin, IV Zosyn.  Discussed with Dr. Channing Mutters.  Patient will be transferred to [facility] the emergency department.  Dr. Maurice March to accept transfer  I personally performed the services described in this documentation, which was scribed in my presence. The recorded information has been reviewed and considered.        Julia Hutching, MD 06/10/12 0001

## 2012-06-09 NOTE — ED Notes (Signed)
Had neck surgery 2 weeks ago, everything has been going good until today per pt. Now having pain at the incision, pain in right arm and right leg, have a horrible headache per pt. Incision turning red per spouse.

## 2012-06-10 MED ORDER — ONDANSETRON HCL 4 MG/2ML IJ SOLN
4.0000 mg | Freq: Once | INTRAMUSCULAR | Status: AC
  Administered 2012-06-10: 4 mg via INTRAVENOUS
  Filled 2012-06-10: qty 2

## 2012-06-10 MED ORDER — HYDROMORPHONE HCL PF 1 MG/ML IJ SOLN
1.0000 mg | Freq: Once | INTRAMUSCULAR | Status: AC
  Administered 2012-06-10: 1 mg via INTRAVENOUS
  Filled 2012-06-10: qty 1

## 2012-06-14 LAB — CULTURE, BLOOD (ROUTINE X 2): Culture: NO GROWTH

## 2012-06-15 ENCOUNTER — Ambulatory Visit (HOSPITAL_COMMUNITY)
Admission: RE | Admit: 2012-06-15 | Discharge: 2012-06-15 | Disposition: A | Discharge status: Home / Self Care | Payer: Medicare Other | Source: Ambulatory Visit | Attending: Neurosurgery | Admitting: Neurosurgery

## 2012-06-15 DIAGNOSIS — L0211 Cutaneous abscess of neck: Secondary | ICD-10-CM | POA: Insufficient documentation

## 2012-06-15 DIAGNOSIS — L03221 Cellulitis of neck: Secondary | ICD-10-CM | POA: Insufficient documentation

## 2012-06-15 MED ORDER — SODIUM CHLORIDE 0.9 % IJ SOLN: 10.0000 mL | Freq: Two times a day (BID) | INTRAMUSCULAR | Status: DC

## 2012-06-15 MED ORDER — SODIUM CHLORIDE 0.9 % IJ SOLN: 10.0000 mL | INTRAMUSCULAR | Status: DC | PRN

## 2012-06-15 NOTE — Progress Notes (Signed)
Single lumen picc . Placement ordered by dr Channing Mutters  From [facility]. Diagnosis surgical site infection neck. Vancomycin infusions at home extended. picc place right cephalic 44cm.

## 2012-06-15 NOTE — Progress Notes (Signed)
Pulled back 1cm per radiology

## 2012-10-15 ENCOUNTER — Other Ambulatory Visit: Payer: Self-pay | Admitting: Family Medicine

## 2012-10-15 DIAGNOSIS — G8929 Other chronic pain: Secondary | ICD-10-CM

## 2012-10-18 ENCOUNTER — Other Ambulatory Visit: Payer: Self-pay | Admitting: Family Medicine

## 2012-10-18 DIAGNOSIS — Z139 Encounter for screening, unspecified: Secondary | ICD-10-CM

## 2012-10-19 ENCOUNTER — Ambulatory Visit (HOSPITAL_COMMUNITY)
Admission: RE | Admit: 2012-10-19 | Discharge: 2012-10-19 | Disposition: A | Discharge status: Home / Self Care | Payer: Medicare Other | Source: Ambulatory Visit | Attending: Family Medicine | Admitting: Family Medicine

## 2012-10-19 DIAGNOSIS — G8929 Other chronic pain: Secondary | ICD-10-CM

## 2012-10-19 DIAGNOSIS — R51 Headache: Secondary | ICD-10-CM | POA: Insufficient documentation

## 2012-10-19 DIAGNOSIS — F29 Unspecified psychosis not due to a substance or known physiological condition: Secondary | ICD-10-CM | POA: Insufficient documentation

## 2012-12-06 ENCOUNTER — Ambulatory Visit (HOSPITAL_COMMUNITY): Payer: Medicare Other

## 2012-12-30 ENCOUNTER — Telehealth: Payer: Self-pay | Admitting: Family Medicine

## 2012-12-30 NOTE — Telephone Encounter (Signed)
Nurse: PtPrudence Davidson dob [phone] Pharm: Georgeanne Nim Rx: Tamiya - 3 called into the pharm - only one had a refill Msg: Per the Pharm. they have not received any follow-up fax request at all. They have faxed in the request 2x but no response.

## 2012-12-30 NOTE — Telephone Encounter (Signed)
Called pharmacy and authorized refills on lisinopril and trazadone. Left message on answering machine notifying patient meds have been called in.

## 2013-01-03 ENCOUNTER — Other Ambulatory Visit: Payer: Self-pay | Admitting: *Deleted

## 2013-01-03 ENCOUNTER — Encounter: Payer: Self-pay | Admitting: *Deleted

## 2013-01-03 MED ORDER — CYCLOBENZAPRINE HCL 10 MG PO TABS: 10.0000 mg | ORAL_TABLET | Freq: Every day | ORAL | Status: DC

## 2013-03-18 ENCOUNTER — Telehealth: Payer: Self-pay | Admitting: *Deleted

## 2013-03-18 ENCOUNTER — Ambulatory Visit (INDEPENDENT_AMBULATORY_CARE_PROVIDER_SITE_OTHER): Payer: Medicare Other | Admitting: Family Medicine

## 2013-03-18 ENCOUNTER — Ambulatory Visit (HOSPITAL_COMMUNITY)
Admission: RE | Admit: 2013-03-18 | Discharge: 2013-03-18 | Disposition: A | Discharge status: Home / Self Care | Payer: Medicare Other | Source: Ambulatory Visit | Attending: Family Medicine | Admitting: Family Medicine

## 2013-03-18 ENCOUNTER — Encounter: Payer: Self-pay | Admitting: Family Medicine

## 2013-03-18 VITALS — BP 142/87 | HR 78 | Wt 203.4 lb

## 2013-03-18 DIAGNOSIS — R5381 Other malaise: Secondary | ICD-10-CM

## 2013-03-18 DIAGNOSIS — L930 Discoid lupus erythematosus: Secondary | ICD-10-CM

## 2013-03-18 DIAGNOSIS — R748 Abnormal levels of other serum enzymes: Secondary | ICD-10-CM | POA: Insufficient documentation

## 2013-03-18 DIAGNOSIS — Z87891 Personal history of nicotine dependence: Secondary | ICD-10-CM | POA: Insufficient documentation

## 2013-03-18 DIAGNOSIS — I10 Essential (primary) hypertension: Secondary | ICD-10-CM

## 2013-03-18 DIAGNOSIS — R5383 Other fatigue: Secondary | ICD-10-CM

## 2013-03-18 DIAGNOSIS — R0989 Other specified symptoms and signs involving the circulatory and respiratory systems: Secondary | ICD-10-CM | POA: Insufficient documentation

## 2013-03-18 DIAGNOSIS — R0609 Other forms of dyspnea: Secondary | ICD-10-CM | POA: Insufficient documentation

## 2013-03-18 DIAGNOSIS — R0600 Dyspnea, unspecified: Secondary | ICD-10-CM

## 2013-03-18 MED ORDER — ESTRADIOL 0.5 MG PO TABS: 0.5000 mg | ORAL_TABLET | Freq: Every day | ORAL | Status: DC

## 2013-03-18 NOTE — Progress Notes (Signed)
Pt has app. [facility] for PFT, April 05, 2013 at 2:30

## 2013-03-18 NOTE — Progress Notes (Signed)
  Subjective:    Patient ID: Julia [name], female    DOB: August 11, 1961, 52 y.o.   MRN: [id number]  HPI Patient in today review medications she also states she's having vaginal dryness issue that did not gets along with Estrace cream she relates her overall energy level is doing halfway fair but other days is poor. She denies high fever chills sweats. She also states no nausea vomiting diarrhea. She does relate some shortness of breath when she pushes herself but otherwise does okay Past medical history please see problem list family history noncontributory  Social no longer smokes  Review of Systems See above    Objective:   Physical Exam Lungs are clear, heart is regular, pulse normal, blood pressure recheck was good abdomen soft extremities no edema skin warm dry       Assessment & Plan:  #1 possible COPD-chest x-ray, pulmonary function tests await results patient was a smoker up for a couple years ago #2 vaginal dryness failed estrogen creams. She requests a low-dose medication to help her with hot flashes and vaginal dryness. Estrace 0.5 one daily we did talk about the potential risk involved with this including heart disease strokes she understands this and is willing to take this. She has had a hysterectomy. #3 fatigue tiredness-we discussed the importance of exercise and good diet. #4 hypertension good control currently. #5 patient does have a history of elevated liver enzymes I think this is due to fatty liver. Her dermatologist lower dose of Plaquenil for lupus issues and now she's having increased joint discomfort. She will talk with the dermatologist about increasing the dose again. We will check him liver profile and send a copy of this to the dermatologist.

## 2013-03-18 NOTE — Telephone Encounter (Signed)
[facility] to number pt gave this nurse [phone] x's 2 of app for 04/05/13 2:30 pm

## 2013-03-19 LAB — CBC WITH DIFFERENTIAL/PLATELET
Eosinophils Relative: 2 % (ref 0–5)
HCT: 43.1 % (ref 36.0–46.0)
Hemoglobin: 15 g/dL (ref 12.0–15.0)
Lymphocytes Relative: 33 % (ref 12–46)
Lymphs Abs: 2.6 10*3/uL (ref 0.7–4.0)
MCV: 87.2 fL (ref 78.0–100.0)
Monocytes Relative: 10 % (ref 3–12)
Platelets: 205 10*3/uL (ref 150–400)
RBC: 4.94 MIL/uL (ref 3.87–5.11)
WBC: 7.8 10*3/uL (ref 4.0–10.5)

## 2013-03-19 LAB — HEPATIC FUNCTION PANEL
Alkaline Phosphatase: 72 U/L (ref 39–117)
Indirect Bilirubin: 0.3 mg/dL (ref 0.0–0.9)
Total Bilirubin: 0.4 mg/dL (ref 0.3–1.2)
Total Protein: 6.7 g/dL (ref 6.0–8.3)

## 2013-03-19 LAB — HEPATITIS C ANTIBODY: HCV Ab: NEGATIVE

## 2013-03-19 LAB — HEPATITIS B SURFACE ANTIGEN: Hepatitis B Surface Ag: NEGATIVE

## 2013-03-21 ENCOUNTER — Encounter: Payer: Self-pay | Admitting: Family Medicine

## 2013-03-22 NOTE — Telephone Encounter (Signed)
Discussed with patient. Pap was before we went to electronic medical records but we have copy in paper chart.  Her chart does list the type of cancer as skin cancer.

## 2013-04-05 ENCOUNTER — Ambulatory Visit (HOSPITAL_COMMUNITY)
Admission: RE | Admit: 2013-04-05 | Discharge: 2013-04-05 | Disposition: A | Discharge status: Home / Self Care | Payer: Medicare Other | Source: Ambulatory Visit | Attending: Family Medicine | Admitting: Family Medicine

## 2013-04-05 DIAGNOSIS — J449 Chronic obstructive pulmonary disease, unspecified: Secondary | ICD-10-CM | POA: Insufficient documentation

## 2013-04-05 DIAGNOSIS — R0989 Other specified symptoms and signs involving the circulatory and respiratory systems: Secondary | ICD-10-CM | POA: Insufficient documentation

## 2013-04-05 DIAGNOSIS — J4489 Other specified chronic obstructive pulmonary disease: Secondary | ICD-10-CM | POA: Insufficient documentation

## 2013-04-05 DIAGNOSIS — R0609 Other forms of dyspnea: Secondary | ICD-10-CM | POA: Insufficient documentation

## 2013-04-07 NOTE — Procedures (Signed)
Julia [name], Julia [name]                ACCOUNT NO.:  [id number]  MEDICAL RECORD NO.:  [id number]  LOCATION:  RESP                          FACILITY:  [facility]  PHYSICIAN:  Carrington Olazabal L. Juanetta Gosling, M.D.DATE OF BIRTH:  1961-01-18  DATE OF PROCEDURE: DATE OF DISCHARGE:  04/05/2013                           PULMONARY FUNCTION TEST   REASON FOR PULMONARY FUNCTION TESTING:  Shortness of breath.  1. Spirometry shows no ventilatory defect, minimal airflow     obstruction. 2. Lung volumes show mild air trapping. 3. DLCO was normal. 4. Airway resistance is slightly high confirming the presence of     airflow obstruction. 5. Noting his smoking history, this study is consistent with COPD.     Duell Holdren L. Juanetta Gosling, M.D.     ELH/MEDQ  D:  04/06/2013  T:  04/07/2013  Job:  [id number]

## 2013-04-18 ENCOUNTER — Telehealth: Payer: Self-pay | Admitting: Family Medicine

## 2013-04-18 LAB — PULMONARY FUNCTION TEST

## 2013-04-18 MED ORDER — TIOTROPIUM BROMIDE MONOHYDRATE 18 MCG IN CAPS: 18.0000 ug | ORAL_CAPSULE | Freq: Every day | RESPIRATORY_TRACT | Status: DC

## 2013-04-18 NOTE — Telephone Encounter (Signed)
Med sent electronically to [facility]. Results discussed with patient. Patient verbalized understanding and will follow up one month after starting med.

## 2013-04-18 NOTE — Telephone Encounter (Signed)
Amazing that the billing is faster than the report! The report was posted to me this am. So here's the results: small amount of airway disease. This represents minimal COPD. We can try therapeutic trial of Spiriva used daily along with her albuterol for prn use (she already has albuterol) If she opts for that then we will rec a f/u ov 4 weeks later to see if it helped.

## 2013-04-18 NOTE — Telephone Encounter (Signed)
Results under notes.

## 2013-04-18 NOTE — Telephone Encounter (Signed)
Patient would like to know the results of Pulmonary function test preformed on 04/05/2013.   States she has already gotten a bill for this test but has not gotten the results yet.  Please call patient as soon as possible.  Thanks.

## 2013-04-20 ENCOUNTER — Other Ambulatory Visit: Payer: Self-pay | Admitting: *Deleted

## 2013-04-20 MED ORDER — HYDROCODONE-ACETAMINOPHEN 10-325 MG PO TABS: 1.0000 | ORAL_TABLET | Freq: Four times a day (QID) | ORAL | Status: DC | PRN

## 2013-05-03 ENCOUNTER — Other Ambulatory Visit: Payer: Self-pay | Admitting: Family Medicine

## 2013-05-04 MED ORDER — CYCLOBENZAPRINE HCL 10 MG PO TABS: 10.0000 mg | ORAL_TABLET | Freq: Every day | ORAL | Status: DC

## 2013-05-09 ENCOUNTER — Encounter: Payer: Self-pay | Admitting: Family Medicine

## 2013-05-25 ENCOUNTER — Encounter: Payer: Self-pay | Admitting: Family Medicine

## 2013-05-28 ENCOUNTER — Other Ambulatory Visit: Payer: Self-pay | Admitting: Family Medicine

## 2013-06-01 ENCOUNTER — Telehealth: Payer: Self-pay | Admitting: Family Medicine

## 2013-06-01 NOTE — Telephone Encounter (Signed)
Patient calls and states she is confused.  She thought she had gotten cyclobenzaprine (FLEXERIL) 10 MG tablet refilled and thrown away.  However, she called the pharmacy to inquiry, they told her she had this filled on 05/10/2013.    Patient stated several times on the phone that she was confused about this whole situation because the bottle she has shows it was filled on 05/10/2013 with no refills.  Now she is out of this medication.  Wants to know if she can get approval for this to be refilled?  cyclobenzaprine (FLEXERIL) 10 MG tablet   Please call Patient. Thanks

## 2013-06-06 ENCOUNTER — Encounter: Payer: Self-pay | Admitting: Family Medicine

## 2013-06-06 ENCOUNTER — Ambulatory Visit (INDEPENDENT_AMBULATORY_CARE_PROVIDER_SITE_OTHER): Payer: Medicare Other | Admitting: Family Medicine

## 2013-06-06 VITALS — BP 144/86 | Ht 63.0 in | Wt 205.6 lb

## 2013-06-06 DIAGNOSIS — G8929 Other chronic pain: Secondary | ICD-10-CM

## 2013-06-06 DIAGNOSIS — F32A Depression, unspecified: Secondary | ICD-10-CM

## 2013-06-06 DIAGNOSIS — M542 Cervicalgia: Secondary | ICD-10-CM

## 2013-06-06 DIAGNOSIS — F329 Major depressive disorder, single episode, unspecified: Secondary | ICD-10-CM

## 2013-06-06 DIAGNOSIS — R51 Headache: Secondary | ICD-10-CM

## 2013-06-06 MED ORDER — DULOXETINE HCL 30 MG PO CPEP: 30.0000 mg | ORAL_CAPSULE | Freq: Every day | ORAL | Status: DC

## 2013-06-06 NOTE — Progress Notes (Signed)
  Subjective:    Patient ID: Julia [name], female    DOB: September 15, 1961, 52 y.o.   MRN: [id number]  HPI Comments: Also needs a refill on meds.  Neck Pain  This is a chronic problem. The current episode started 1 to 4 weeks ago. The problem occurs intermittently. She has tried acetaminophen for the symptoms.   Patient states she needs refills on her Flexeril. She is good on her other medicines. She also states the neck pain is severe and the back of the neck radiates into the right trapezius does not go down the arm she had surgery approximately year ago she had a severe infection afterwards she does not want to go back to the hospital that her previous surgeon works with. Therefore she will end up needing a new neurosurgeon to consult with. She relates the pain as being a 5-7/10 on a daily basis pain medicine takes the edge off but it still gives her severe problems. She relates that she does not want him to keep taking the pain medication. She would like to be on a reduced it but the pain is preventing her from doing so. Patient does have a history of hypertension reflux issues obesity. Family history noncontributory  Patient also relates that her depression is acting. She has had some suicidal ideation but no specific plan she states she does not want to hurt her self she just would like additional medication. She states that she has seen a Veterinary surgeon. She denies wanting to hurt her self currently and agrees to seeking help immediately here or in the ER should she start to feel like she is going her are soft.   Review of Systems  HENT: Positive for neck pain.    See above denies chest pain shortness of breath     Objective:   Physical Exam She has severe scarring in the back of her neck where the previous surgery and the infection was her lungs are clear hearts regular she has subjective discomfort throughout the back of her neck. Range of motion of the neck is impaired  The strength in her arms  are good reflexes are diminished on the right side     Assessment & Plan:  Significant neck problems had previous surgery including hardware placed more tha likely will need a MRI scan of this area will discuss this with radiology before ordering.  I spoke with radiology I discussed the case with the radiologist. He states that MRI without contrast would be the best test to look at this patient's neck as well as the hardware plus also look for any type of herniation in the disc. He states that CAT scan would not completely see this area the way it should.  Chronic pain hydrocodone as necessary Flexeril at nighttime cautioned drowsinessn  Depression issues-add the Cymbalta. Followup within 2 weeks. Patient did an agreement that if she feels suicidal immediately come here or go to the ER

## 2013-06-07 ENCOUNTER — Other Ambulatory Visit: Payer: Self-pay

## 2013-06-07 ENCOUNTER — Encounter: Payer: Self-pay | Admitting: Family Medicine

## 2013-06-07 DIAGNOSIS — G8929 Other chronic pain: Secondary | ICD-10-CM | POA: Insufficient documentation

## 2013-06-07 MED ORDER — CYCLOBENZAPRINE HCL 10 MG PO TABS: 10.0000 mg | ORAL_TABLET | Freq: Every day | ORAL | Status: DC

## 2013-06-07 NOTE — Telephone Encounter (Signed)
plz give 12 refills, I though I sent a fax on this to nursing station last evening

## 2013-06-07 NOTE — Telephone Encounter (Signed)
Flexeril RX was sent to pharmacy. Patient was notified.

## 2013-06-22 ENCOUNTER — Other Ambulatory Visit: Payer: Self-pay | Admitting: *Deleted

## 2013-06-22 ENCOUNTER — Telehealth: Payer: Self-pay | Admitting: *Deleted

## 2013-06-22 DIAGNOSIS — M542 Cervicalgia: Secondary | ICD-10-CM

## 2013-06-22 NOTE — Telephone Encounter (Signed)
Pt notified of mri appt. 06/30/13 register 2:45.

## 2013-06-22 NOTE — Telephone Encounter (Signed)
Left message to return to call to notify pt mri scheduled [facility] 06/30/13 register 2:45

## 2013-06-30 ENCOUNTER — Ambulatory Visit (HOSPITAL_COMMUNITY)
Admission: RE | Admit: 2013-06-30 | Discharge: 2013-06-30 | Disposition: A | Discharge status: Home / Self Care | Payer: Medicare Other | Source: Ambulatory Visit | Attending: Family Medicine | Admitting: Family Medicine

## 2013-06-30 DIAGNOSIS — M25519 Pain in unspecified shoulder: Secondary | ICD-10-CM | POA: Insufficient documentation

## 2013-06-30 DIAGNOSIS — M542 Cervicalgia: Secondary | ICD-10-CM | POA: Insufficient documentation

## 2013-06-30 DIAGNOSIS — M5020 Other cervical disc displacement, unspecified cervical region: Secondary | ICD-10-CM | POA: Insufficient documentation

## 2013-07-01 ENCOUNTER — Telehealth: Payer: Self-pay | Admitting: Family Medicine

## 2013-07-01 MED ORDER — LISINOPRIL 5 MG PO TABS: 5.0000 mg | ORAL_TABLET | Freq: Every day | ORAL | Status: DC

## 2013-07-01 MED ORDER — TRAZODONE HCL 100 MG PO TABS: 100.0000 mg | ORAL_TABLET | Freq: Every evening | ORAL | Status: DC | PRN

## 2013-07-01 NOTE — Telephone Encounter (Signed)
Medications sent to pharmacy. Patient was notified.  

## 2013-07-01 NOTE — Telephone Encounter (Signed)
Patient needs Rx for lisinopril, trazodone   [facility]

## 2013-07-01 NOTE — Telephone Encounter (Signed)
That may give 6 refills each

## 2013-07-05 ENCOUNTER — Ambulatory Visit (HOSPITAL_COMMUNITY): Payer: Medicare Other | Admitting: Psychiatry

## 2013-07-05 ENCOUNTER — Encounter (HOSPITAL_COMMUNITY): Payer: Self-pay | Admitting: Psychiatry

## 2013-07-05 ENCOUNTER — Ambulatory Visit (INDEPENDENT_AMBULATORY_CARE_PROVIDER_SITE_OTHER): Payer: Medicare Other | Admitting: Psychiatry

## 2013-07-05 VITALS — BP 170/90 | Ht 63.0 in | Wt 204.0 lb

## 2013-07-05 DIAGNOSIS — F329 Major depressive disorder, single episode, unspecified: Secondary | ICD-10-CM

## 2013-07-05 MED ORDER — DULOXETINE HCL 60 MG PO CPEP: 60.0000 mg | ORAL_CAPSULE | Freq: Every day | ORAL | Status: DC
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
## 2013-07-06 ENCOUNTER — Other Ambulatory Visit: Payer: Self-pay | Admitting: Family Medicine

## 2013-07-06 DIAGNOSIS — M5090 Cervical disc disorder, unspecified, unspecified cervical region: Secondary | ICD-10-CM

## 2013-07-07 ENCOUNTER — Other Ambulatory Visit: Payer: Self-pay | Admitting: *Deleted

## 2013-07-07 MED ORDER — ESTRADIOL 0.5 MG PO TABS: 0.5000 mg | ORAL_TABLET | Freq: Every day | ORAL | Status: DC

## 2013-07-24 IMAGING — CT CT NECK W/ CM
4 series · 15 of 33 positions shown, 18 images · IV contrast (Omnipaque 300)
Comparison: 06/02/2012 radiograph

CLINICAL DATA: Recent spine fusion, now with fever.

CT NECK WITH CONTRAST
TECHNIQUE: Multidetector CT imaging of the neck was performed with
intravenous contrast.
Contrast: 100mL OMNIPAQUE IOHEXOL 300 MG/ML  SOLN

[Series 2: soft tissue neck 2.0 b31s · axial · 0.45mm/px · z∈[+93,+241]mm · 5 of 112 slices shown, 7 images]
[im 19/112  soft-tissue]
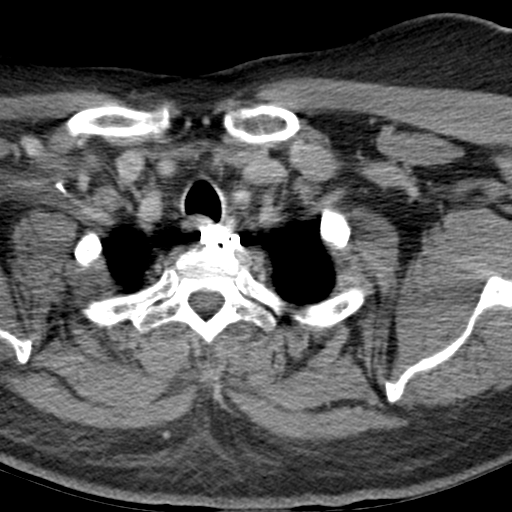
[im 19/112  bone]
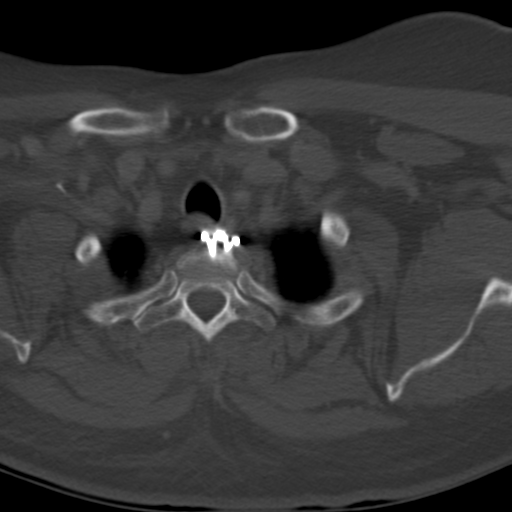
[im 38/112  bone]
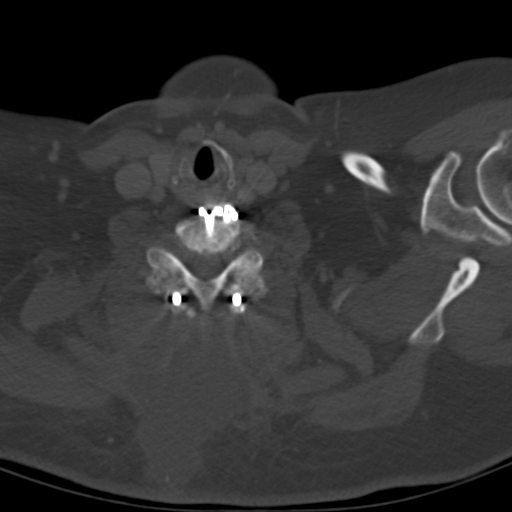
[im 56/112  bone]
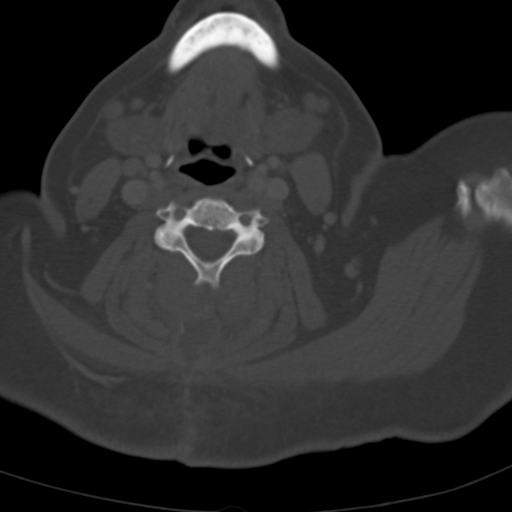
[im 75/112  bone]
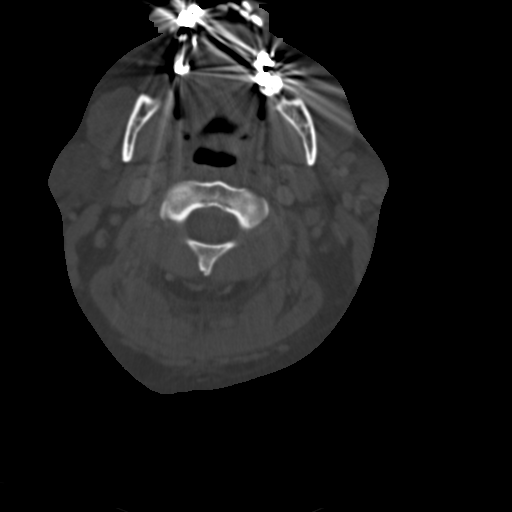
[im 93/112  soft-tissue]
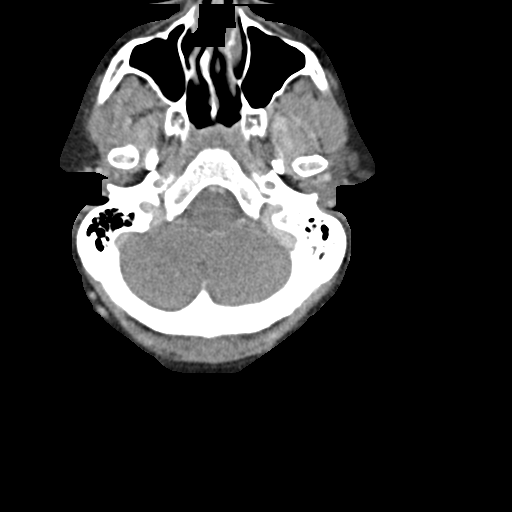
[im 93/112  bone]
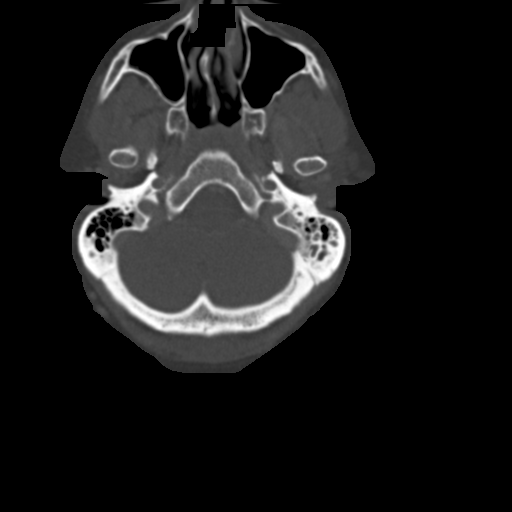

[Series 4: neck 2.0 soft tissue sag · sagittal · 0.44mm/px · 5 of 88 slices shown, 6 images]
[im 30/88  bone]
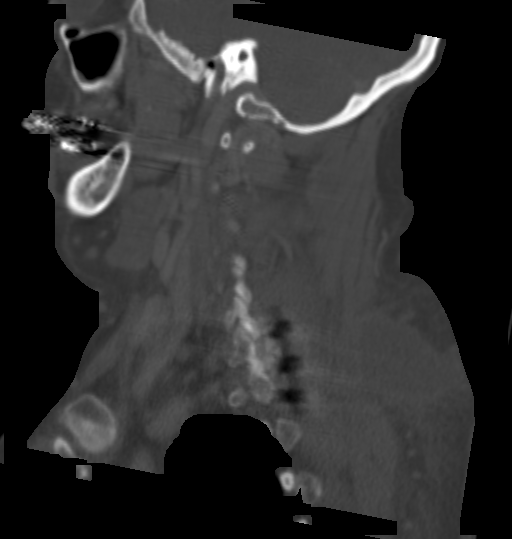
[im 37/88  bone]
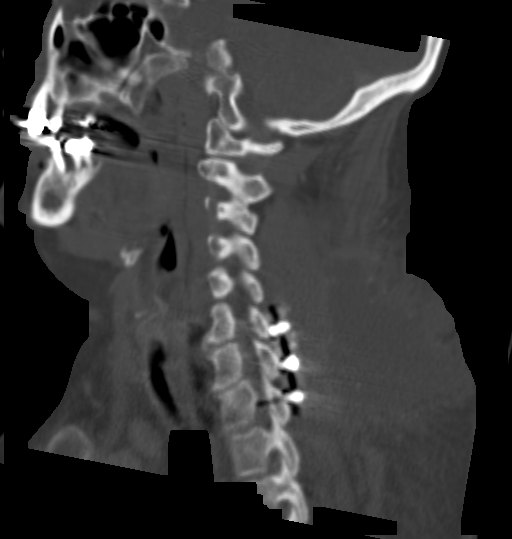
[im 44/88  soft-tissue]
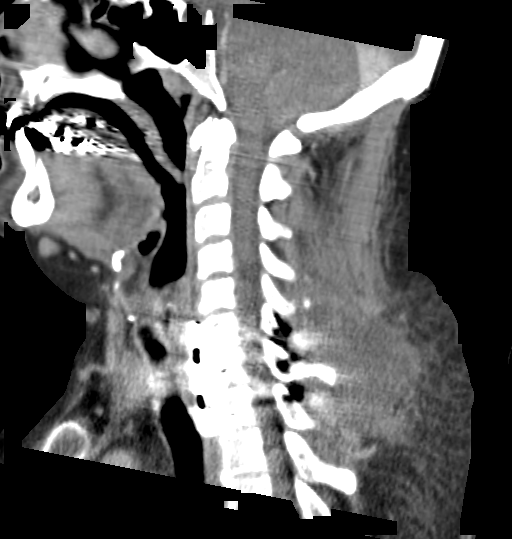
[im 44/88  bone]
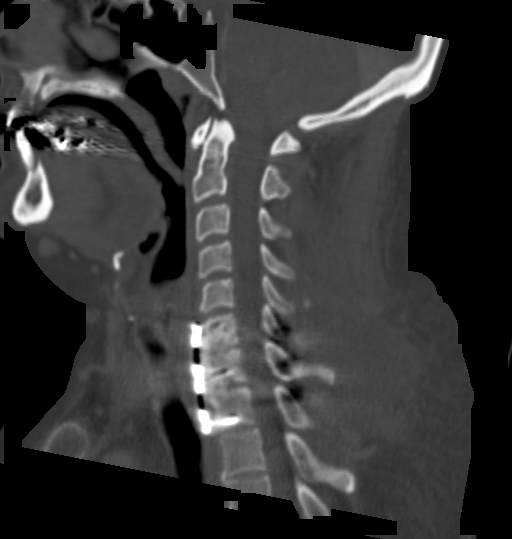
[im 51/88  bone]
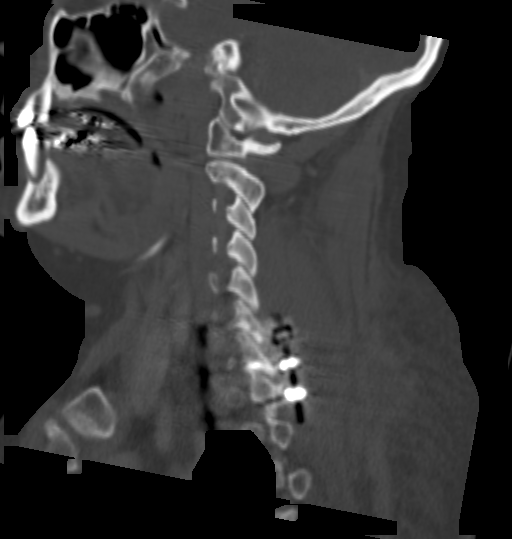
[im 59/88  bone]
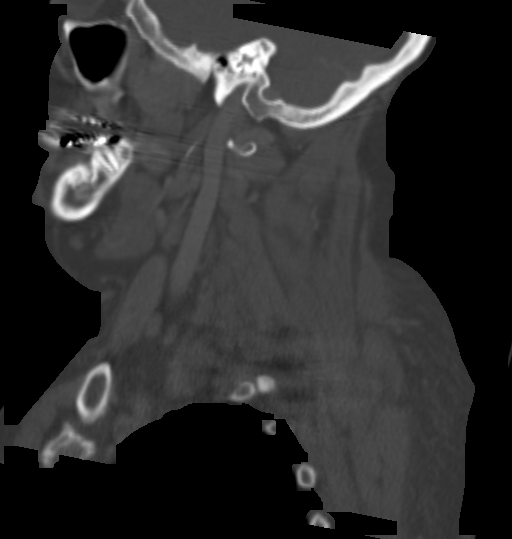

[Series 5: neck 2.0 soft tissue coro · coronal · 0.39mm/px · 3 of 99 slices shown]
[im 23/99  bone]
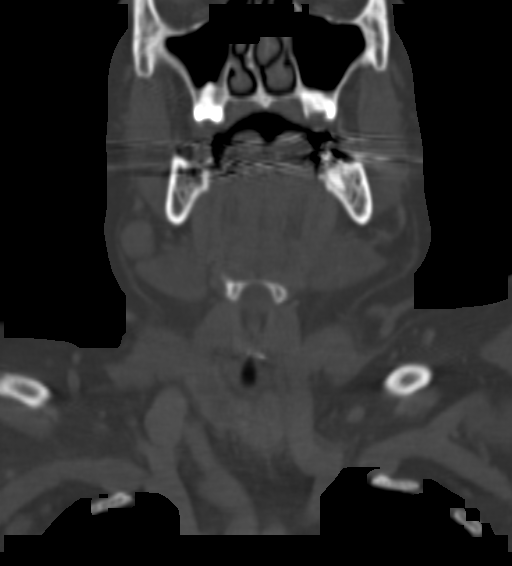
[im 41/99  bone]
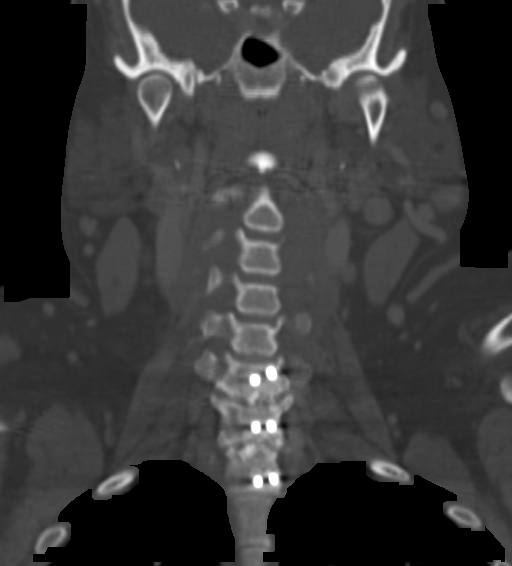
[im 58/99  bone]
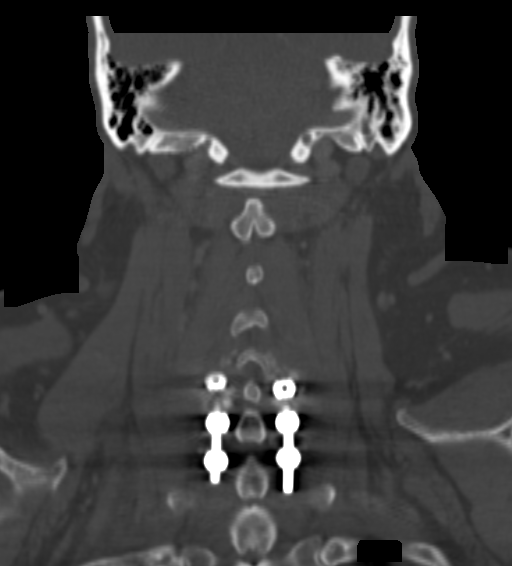

[Series 6: axial soft tissue neck 2.0 · axial · 0.38mm/px · z∈[+69,+103]mm · 2 of 111 slices shown]
[im 19/111  soft-tissue]
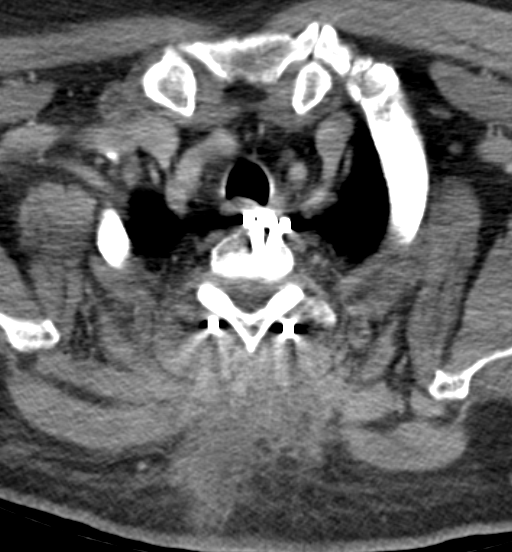
[im 37/111  soft-tissue]
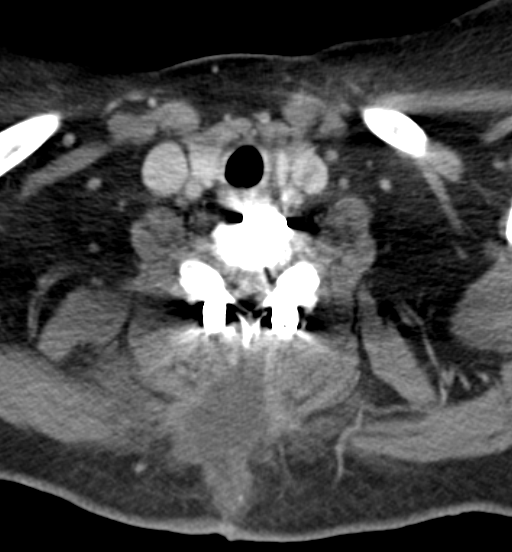

[15 of 33 positions shown; findings below may reference images not displayed]

FINDINGS: Postoperative changes status post anterior and posterior
C6-T1 fusion.  There is a 7.3 x 6.2 cm fluid collection posterior
to the operative levels, extending from the posterior elements of
the vertebral bodies to the skin surface.  Involvement of the
underlying bone/hardware or extension into the canal cannot be
excluded on this examination.

Visualized intracranial contents within normal limits.  Lung apices
are clear.  Unremarkable nasal cavity, nasopharynx, oral cavity,
oropharynx, hypopharynx, larynx, epiglottis.  Patent airway.

Symmetric parotid and submandibular glands.  There are prominent
cervical chain lymph nodes, left greater than right, nonspecific
however may be reactive.

Due to the timing of the contrast bolus, cannot evaluate the
vertebral arteries in their entirety. The vessels are patent or
visualized.
IMPRESSION: Posterior C6-T1 fusion with a 7.3 x 6.2 cm fluid collection
extending from the vertebral bodies/posterior hardware to the skin
surface.  There is a rim enhancement, therefore infection
suggested, and underlying osseous/hardware involvement cannot be
excluded. Recommend sampling the fluid.

Discussed via telephone with Dr. Manda at [DATE] p.m. on 06/09/2012.

## 2013-07-29 ENCOUNTER — Ambulatory Visit (INDEPENDENT_AMBULATORY_CARE_PROVIDER_SITE_OTHER): Payer: Medicare Other | Admitting: Psychology

## 2013-07-29 DIAGNOSIS — F329 Major depressive disorder, single episode, unspecified: Secondary | ICD-10-CM

## 2013-07-30 IMAGING — CR DG CHEST 1V PORT
1 series · 1 of 1 positions shown · non-contrast
Comparison: 01/31/2011.

CLINICAL DATA: PICC placement.

PORTABLE CHEST - 1 VIEW

[view not recorded]
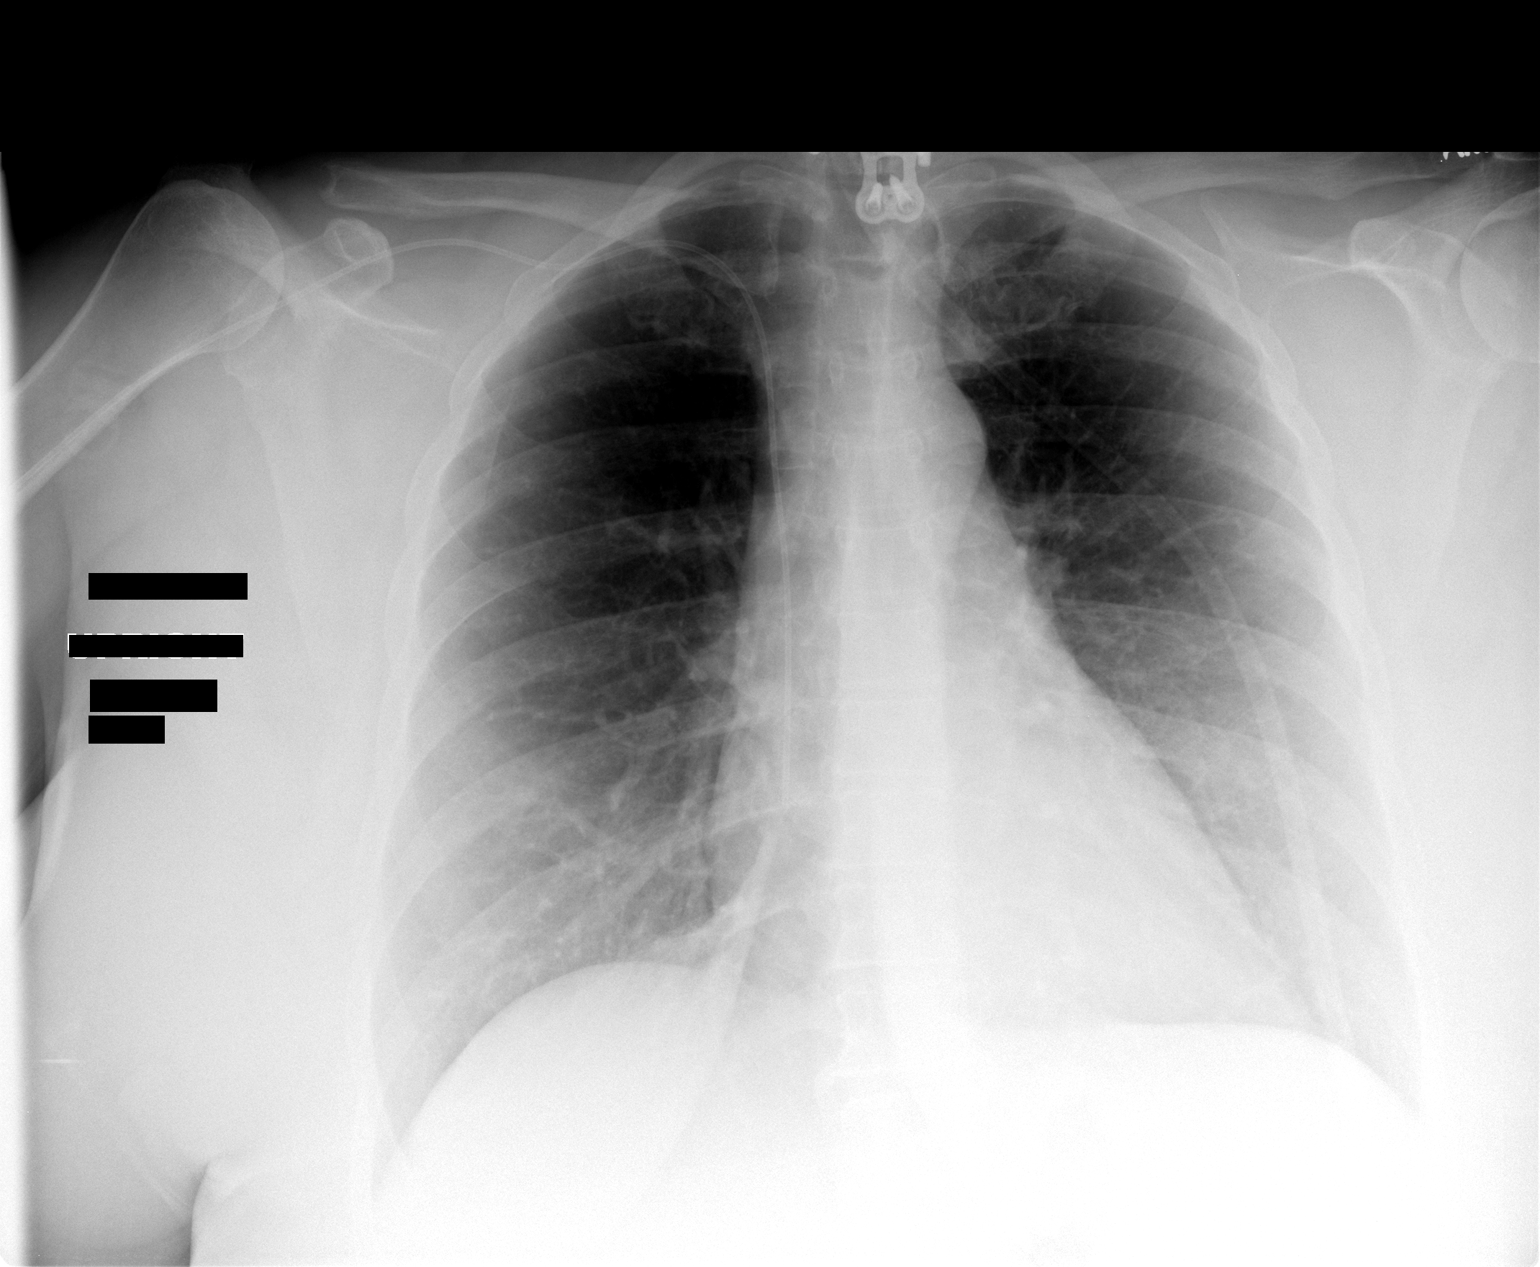

[1 of 1 positions shown; findings below may reference images not displayed]

FINDINGS: New right upper extremity PICC is present.  The tip is
slightly greater than two vertebral bodies below the carina,
compatible with position within the right atrium.  PICC can be
withdrawn between one and 2 cm for positioning at the cavoatrial
junction.  Cardiopericardial silhouette appears within normal
limits. Heart and lungs appear within normal limits.
IMPRESSION: New right upper extremity PICC with the tip slightly low.  PICC
could be withdrawn between 1 and 2 cm for positioning at the
cavoatrial junction.

## 2013-08-02 ENCOUNTER — Ambulatory Visit (INDEPENDENT_AMBULATORY_CARE_PROVIDER_SITE_OTHER): Payer: Medicare Other | Admitting: Psychiatry

## 2013-08-02 ENCOUNTER — Encounter (HOSPITAL_COMMUNITY): Payer: Self-pay | Admitting: Psychiatry

## 2013-08-02 VITALS — BP 142/84 | Ht 63.0 in | Wt 207.0 lb

## 2013-08-02 DIAGNOSIS — F332 Major depressive disorder, recurrent severe without psychotic features: Secondary | ICD-10-CM

## 2013-08-02 DIAGNOSIS — F329 Major depressive disorder, single episode, unspecified: Secondary | ICD-10-CM

## 2013-08-02 MED ORDER — GABAPENTIN 300 MG PO CAPS: 300.0000 mg | ORAL_CAPSULE | Freq: Three times a day (TID) | ORAL | Status: DC
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
## 2013-08-15 ENCOUNTER — Ambulatory Visit (INDEPENDENT_AMBULATORY_CARE_PROVIDER_SITE_OTHER): Payer: Medicare Other | Admitting: Psychology

## 2013-08-15 DIAGNOSIS — F329 Major depressive disorder, single episode, unspecified: Secondary | ICD-10-CM

## 2013-08-30 ENCOUNTER — Ambulatory Visit (INDEPENDENT_AMBULATORY_CARE_PROVIDER_SITE_OTHER): Payer: No Typology Code available for payment source | Admitting: Psychiatry

## 2013-08-30 ENCOUNTER — Encounter (HOSPITAL_COMMUNITY): Payer: Self-pay | Admitting: Psychiatry

## 2013-08-30 VITALS — BP 150/84 | Ht 63.0 in | Wt 205.0 lb

## 2013-08-30 DIAGNOSIS — F332 Major depressive disorder, recurrent severe without psychotic features: Secondary | ICD-10-CM

## 2013-08-30 DIAGNOSIS — F329 Major depressive disorder, single episode, unspecified: Secondary | ICD-10-CM

## 2013-08-30 MED ORDER — DULOXETINE HCL 60 MG PO CPEP: 60.0000 mg | ORAL_CAPSULE | Freq: Two times a day (BID) | ORAL | Status: DC
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
## 2013-08-31 ENCOUNTER — Other Ambulatory Visit (HOSPITAL_COMMUNITY): Payer: Self-pay | Admitting: Neurosurgery

## 2013-08-31 DIAGNOSIS — M542 Cervicalgia: Secondary | ICD-10-CM

## 2013-09-02 ENCOUNTER — Ambulatory Visit (HOSPITAL_COMMUNITY): Admission: RE | Admit: 2013-09-02 | Payer: Medicare Other | Source: Ambulatory Visit

## 2013-09-02 ENCOUNTER — Other Ambulatory Visit (HOSPITAL_COMMUNITY): Payer: Self-pay

## 2013-09-12 ENCOUNTER — Ambulatory Visit (HOSPITAL_COMMUNITY): Payer: Self-pay | Admitting: Psychology

## 2013-09-12 ENCOUNTER — Telehealth (HOSPITAL_COMMUNITY): Payer: Self-pay

## 2013-09-12 ENCOUNTER — Telehealth: Payer: Self-pay | Admitting: Family Medicine

## 2013-09-12 MED ORDER — HYDROCODONE-ACETAMINOPHEN 10-325 MG PO TABS: 1.0000 | ORAL_TABLET | Freq: Four times a day (QID) | ORAL | Status: DC | PRN

## 2013-09-12 NOTE — Telephone Encounter (Signed)
Also, patient would like Rx for hydrocodone

## 2013-09-12 NOTE — Telephone Encounter (Signed)
This patient is due for office visit. Explained to patient hydrocodone is now a schedule 2 drug. She may have a prescription for 60 tablets this should get her by for a couple weeks. As for the diarrhea I recommend OTC Imodium. This should help. It would be wise to go ahead and schedule her now before appointment visits get locked in. She needs to be seen before Christmas.

## 2013-09-12 NOTE — Telephone Encounter (Signed)
Pt having constant diarrhea on Cymbalta 60 bid, will alternate 60/day with 120 perday

## 2013-09-12 NOTE — Telephone Encounter (Signed)
Notified patient hydrocodone is now a schedule 2 drug. May have a prescription for 60 tablets this should get her by for a couple weeks. As for the diarrhea recommend OTC Imodium. This should help. Needs to be seen before Christmas. Patient verbalized understanding.

## 2013-09-12 NOTE — Telephone Encounter (Signed)
Patient would like something called in for diarrhea. She said she has tried OTC meds, but no relief.   [name]

## 2013-09-19 ENCOUNTER — Ambulatory Visit (HOSPITAL_COMMUNITY): Payer: Self-pay | Admitting: Psychiatry

## 2013-09-20 ENCOUNTER — Ambulatory Visit (HOSPITAL_COMMUNITY)
Admission: RE | Admit: 2013-09-20 | Discharge: 2013-09-20 | Disposition: A | Discharge status: Home / Self Care | Payer: Medicare Other | Source: Ambulatory Visit | Attending: Neurosurgery | Admitting: Neurosurgery

## 2013-09-20 DIAGNOSIS — R937 Abnormal findings on diagnostic imaging of other parts of musculoskeletal system: Secondary | ICD-10-CM | POA: Insufficient documentation

## 2013-09-20 DIAGNOSIS — Z981 Arthrodesis status: Secondary | ICD-10-CM | POA: Insufficient documentation

## 2013-09-20 DIAGNOSIS — M542 Cervicalgia: Secondary | ICD-10-CM | POA: Insufficient documentation

## 2013-09-20 DIAGNOSIS — Z9889 Other specified postprocedural states: Secondary | ICD-10-CM | POA: Insufficient documentation

## 2013-09-26 ENCOUNTER — Encounter (HOSPITAL_COMMUNITY): Payer: Self-pay | Admitting: Psychology

## 2013-09-26 ENCOUNTER — Encounter (HOSPITAL_COMMUNITY): Payer: Self-pay | Admitting: Psychiatry

## 2013-09-26 ENCOUNTER — Ambulatory Visit (INDEPENDENT_AMBULATORY_CARE_PROVIDER_SITE_OTHER): Payer: Medicare Other | Admitting: Psychiatry

## 2013-09-26 VITALS — BP 130/80 | Ht 63.0 in | Wt 206.0 lb

## 2013-09-26 DIAGNOSIS — F329 Major depressive disorder, single episode, unspecified: Secondary | ICD-10-CM

## 2013-09-26 DIAGNOSIS — F332 Major depressive disorder, recurrent severe without psychotic features: Secondary | ICD-10-CM

## 2013-09-26 MED ORDER — METHYLPHENIDATE HCL 10 MG PO TABS: 10.0000 mg | ORAL_TABLET | Freq: Two times a day (BID) | ORAL | Status: DC

## 2013-09-26 MED ORDER — TRAZODONE HCL 100 MG PO TABS: 100.0000 mg | ORAL_TABLET | Freq: Every evening | ORAL | Status: DC | PRN

## 2013-09-26 MED ORDER — GABAPENTIN 300 MG PO CAPS: 300.0000 mg | ORAL_CAPSULE | Freq: Three times a day (TID) | ORAL | Status: DC

## 2013-09-26 NOTE — Progress Notes (Signed)
Patient:  Julia [name]   DOB: [birth year]  MR Number: [id number]  Location: [facility]- [address] [place] [address] Dept: [phone]  Start: 4 PM End: 5 PM  Provider/Observer:     Hershal Coria PSYD  Chief Complaint:      Chief Complaint  Patient presents with  . Depression    Reason For Service:     The patient was referred by Dr. Tenny Craw because of significant and ongoing issues related to depression. The patient is described as always having trouble with depression and is not sure why she is a much trouble coping. There are a lot of family stressors and other issues for her depression.  Interventions Strategy:  Cognitive/behavioral psychotherapeutic interventions  Participation Level:   Active  Participation Quality:  Appropriate      Behavioral Observation:  Well Groomed, Alert, and Appropriate.   Current Psychosocial Factors: The patient reports that she has had a lot of issues related to depression that is recurrent without a particular cause or etiology factor being able to be identified by the patient.  Content of Session:   Reviewed current symptoms and continued work on therapeutic interventions related to issues of depression utilizing cognitive/behavioral psychotherapeutic modality.  Current Status:   The patient reports that her depression has been little bit better recently but continues to have significant negative effect on her functioning.  Patient Progress:   Stable  Target Goals:   Target goals include reducing the intensity, severity, and duration of depressive symptoms.  Last Reviewed:   07/29/2013  Goals Addressed Today:    Today we worked on issues related to building coping skills and strategies around better management of her recurrent depression.  Impression/Diagnosis:   The patient is a long history of depression is been treated psychiatrically by Dr.  Tenny Craw most recently.  Diagnosis:    Axis I: Depression

## 2013-09-26 NOTE — Progress Notes (Signed)
Patient:  Julia [name]   DOB: 1961-07-24  MR Number: [id number]  Location: BEHAVIORAL [facility] [address] [place] [address] Dept: [phone]  Start: 4 PM End: 5 PM  Provider/Observer:     Hershal Coria PSYD  Chief Complaint:      Chief Complaint  Patient presents with  . Depression    Reason For Service:     The patient was referred by Dr. Tenny Craw because of significant and ongoing issues related to depression. The patient is described as always having trouble with depression and is not sure why she is a much trouble coping. There are a lot of family stressors and other issues for her depression.  Interventions Strategy:  Cognitive/behavioral psychotherapeutic interventions  Participation Level:   Active  Participation Quality:  Appropriate      Behavioral Observation:  Well Groomed, Alert, and Appropriate.   Current Psychosocial Factors: The patient reports that she has been exercising at least 5 minutes every day and working on trying to improve that. She reports that she's been working on her diet and improving her nutrition as well as working on her sleep patterns. She reports that there is been a significant improvement in her sleep since working on these interventions. The patient reports that she still feels fatigued he needs to continue to work on these issues. We continued work on cognitive/behavioral psychotherapeutic interventions.  Content of Session:   Reviewed current symptoms and continued work on therapeutic interventions related to issues of depression utilizing cognitive/behavioral psychotherapeutic modality.  Current Status:   The patient reports that her depression has been little bit better recently but continues to have significant negative effect on her functioning.  Patient Progress:   Stable  Target Goals:   Target goals include reducing the intensity, severity, and  duration of depressive symptoms.  Last Reviewed:   08/15/2013  Goals Addressed Today:    Today we worked on issues related to building coping skills and strategies around better management of her recurrent depression.  Impression/Diagnosis:   The patient is a long history of depression is been treated psychiatrically by Dr. Tenny Craw most recently.  Diagnosis:    Axis I: Depression
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
## 2013-09-29 ENCOUNTER — Ambulatory Visit (INDEPENDENT_AMBULATORY_CARE_PROVIDER_SITE_OTHER): Payer: Medicare Other | Admitting: Family Medicine

## 2013-09-29 ENCOUNTER — Encounter: Payer: Self-pay | Admitting: Family Medicine

## 2013-09-29 VITALS — BP 142/90 | Ht 63.0 in | Wt 208.4 lb

## 2013-09-29 DIAGNOSIS — I10 Essential (primary) hypertension: Secondary | ICD-10-CM

## 2013-09-29 DIAGNOSIS — J449 Chronic obstructive pulmonary disease, unspecified: Secondary | ICD-10-CM

## 2013-09-29 DIAGNOSIS — M542 Cervicalgia: Secondary | ICD-10-CM

## 2013-09-29 DIAGNOSIS — R51 Headache: Secondary | ICD-10-CM

## 2013-09-29 DIAGNOSIS — G8929 Other chronic pain: Secondary | ICD-10-CM

## 2013-09-29 MED ORDER — OXYCODONE-ACETAMINOPHEN 10-325 MG PO TABS: 1.0000 | ORAL_TABLET | ORAL | Status: DC | PRN

## 2013-09-29 NOTE — Progress Notes (Signed)
   Subjective:    Patient ID: Julia [name], female    DOB: [birth year], 52 y.o.   MRN: [id number]  HPI Patient is here today for a refill on her hydrocodone. She relates severe pain in her neck into the right arm she's had previous surgery had a followup MRI and CT scan unfortunately neither one showed anything that could be corrected. Significant progression and pain over the past few months with increasing the pain medication. Hydrocodone no longer working. See discussion below.  Past medical history was reviewed has hypertension reflux arthritis Smoking history quit smoking couple years ago No other concerns.  She does relate some shortness of breath with activity denies any chest pains   Review of Systems She denies fevers chills sweats nausea vomiting she denies chest pain she does get short of breath with activity she has had abnormal pulmonary functions in the past    Objective:   Physical Exam Esses there is tenderness in the posterior aspect of the neck previous surgery also tenderness into the right trapezius left trapezius nontender lungs are clear hearts regular abdomen soft extremities no edema skin warm dry       Assessment & Plan:  #1 chronic pain-patient has chronic cervical pain and discomfort hydrocodone is no longer helping she is taking 2 at a time and still without relief I recommend Percocet 10 mg/325 mg 1 every 4 hours as needed, #180. Followup if ongoing troubles. If this does good for her she can call back for additional prescriptions we should see her back in 3 months  #2 HTN blood pressure on recheck was good  #3 mild groin strain versus cramp-I. do not find any evidence of a DVT and this area was tender when she got up off the exam table I recommended that if it persists to notify us  #4 mild COPD abnormal PFTs a year ago she does have a history of smoking.  25 minutes spent with patient She will need to followup in approximately 3 months we will do lab work  before her next visit

## 2013-10-03 ENCOUNTER — Ambulatory Visit (INDEPENDENT_AMBULATORY_CARE_PROVIDER_SITE_OTHER): Payer: Medicare Other | Admitting: Psychology

## 2013-10-03 ENCOUNTER — Encounter (HOSPITAL_COMMUNITY): Payer: Self-pay | Admitting: Psychology

## 2013-10-03 DIAGNOSIS — F329 Major depressive disorder, single episode, unspecified: Secondary | ICD-10-CM

## 2013-10-03 NOTE — Progress Notes (Signed)
Patient:  Julia [name]   DOB: Nov 19, 1960  MR Number: [id number]  Location: BEHAVIORAL [facility] [address] [place] [address] Dept: [phone]  Start: 4 PM End: 5 PM  Provider/Observer:     Hershal Coria PSYD  Chief Complaint:      Chief Complaint  Patient presents with  . Depression    Reason For Service:     The patient was referred by Dr. Tenny Craw because of significant and ongoing issues related to depression. The patient is described as always having trouble with depression and is not sure why she is a much trouble coping. There are a lot of family stressors and other issues for her depression.  Interventions Strategy:  Cognitive/behavioral psychotherapeutic interventions  Participation Level:   Active  Participation Quality:  Appropriate      Behavioral Observation:  Well Groomed, Alert, and Appropriate.   Current Psychosocial Factors: .  Content of Session:   Reviewed current symptoms and continued work on therapeutic interventions related to issues of depression utilizing cognitive/behavioral psychotherapeutic modality.  Current Status:   Patienr reports that Ritalin is doing better with more energy and no increase in anxiety but sleep still mild issue.  Patient Progress:   Stable  Target Goals:   Target goals include reducing the intensity, severity, and duration of depressive symptoms.  Last Reviewed:   10/03/2013  Goals Addressed Today:    Today we worked on issues related to building coping skills and strategies around better management of her recurrent depression.  Impression/Diagnosis:   The patient is a long history of depression is been treated psychiatrically by Dr. Tenny Craw most recently.  Diagnosis:    Axis I: Depression

## 2013-10-25 ENCOUNTER — Encounter (HOSPITAL_COMMUNITY): Payer: Self-pay | Admitting: Psychiatry

## 2013-10-25 ENCOUNTER — Ambulatory Visit (INDEPENDENT_AMBULATORY_CARE_PROVIDER_SITE_OTHER): Payer: Medicare HMO | Admitting: Psychiatry

## 2013-10-25 VITALS — BP 140/84 | Ht 63.0 in | Wt 207.0 lb

## 2013-10-25 DIAGNOSIS — F32A Depression, unspecified: Secondary | ICD-10-CM

## 2013-10-25 DIAGNOSIS — F332 Major depressive disorder, recurrent severe without psychotic features: Secondary | ICD-10-CM

## 2013-10-25 DIAGNOSIS — F329 Major depressive disorder, single episode, unspecified: Secondary | ICD-10-CM

## 2013-10-25 MED ORDER — DULOXETINE HCL 60 MG PO CPEP: 60.0000 mg | ORAL_CAPSULE | Freq: Two times a day (BID) | ORAL | Status: DC

## 2013-10-25 MED ORDER — METHYLPHENIDATE HCL 20 MG PO TABS: 20.0000 mg | ORAL_TABLET | Freq: Three times a day (TID) | ORAL | Status: DC
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
## 2013-10-27 ENCOUNTER — Telehealth: Payer: Self-pay | Admitting: Family Medicine

## 2013-10-27 ENCOUNTER — Ambulatory Visit (INDEPENDENT_AMBULATORY_CARE_PROVIDER_SITE_OTHER): Payer: Medicare HMO | Admitting: Family Medicine

## 2013-10-27 ENCOUNTER — Other Ambulatory Visit (HOSPITAL_COMMUNITY): Payer: Self-pay | Admitting: Psychiatry

## 2013-10-27 ENCOUNTER — Encounter: Payer: Self-pay | Admitting: Family Medicine

## 2013-10-27 ENCOUNTER — Telehealth (HOSPITAL_COMMUNITY): Payer: Self-pay | Admitting: Psychiatry

## 2013-10-27 VITALS — BP 138/90 | Ht 63.0 in | Wt 209.0 lb

## 2013-10-27 DIAGNOSIS — M542 Cervicalgia: Secondary | ICD-10-CM

## 2013-10-27 DIAGNOSIS — G8929 Other chronic pain: Secondary | ICD-10-CM

## 2013-10-27 DIAGNOSIS — Z79899 Other long term (current) drug therapy: Secondary | ICD-10-CM

## 2013-10-27 DIAGNOSIS — I10 Essential (primary) hypertension: Secondary | ICD-10-CM

## 2013-10-27 DIAGNOSIS — R7301 Impaired fasting glucose: Secondary | ICD-10-CM

## 2013-10-27 MED ORDER — METHYLPHENIDATE HCL 20 MG PO TABS: 20.0000 mg | ORAL_TABLET | Freq: Three times a day (TID) | ORAL | Status: DC

## 2013-10-27 MED ORDER — HYDROCODONE-ACETAMINOPHEN 10-325 MG PO TABS: 1.0000 | ORAL_TABLET | ORAL | Status: DC | PRN

## 2013-10-27 MED ORDER — ESTRADIOL 1 MG PO TABS: 1.0000 mg | ORAL_TABLET | Freq: Every day | ORAL | Status: DC

## 2013-10-27 NOTE — Telephone Encounter (Signed)
Pt should have been given 90 tabs, will rewrite script

## 2013-10-27 NOTE — Telephone Encounter (Signed)
This patient will need lipid profile, liver profile, metabolic 7 hemoglobin B5M

## 2013-10-27 NOTE — Progress Notes (Signed)
   Subjective:    Patient ID: Julia [name], female    DOB: Mar 06, 1961, 53 y.o.   MRN: [id number]  HPI Patient is here today to get a refill on her meds, especially her oxycodone.   She would also like to see if she could increase her Estrace. She does relate that she is having a lot of hot flashes and vaginal dryness she understands that that Estrace does slightly increase her risk of cancers but she states quality of life is more important at this point    Review of Systems She relates neck pain this discomfort in the back of her head as well pain medication helps this denies chest tightness pressure pain shortness breath    Objective:   Physical Exam Her neck has obvious scarring from where she had surgery regional tenderness lungs are clear hearts regular blood pressure good on recheck       Assessment & Plan:  #1 chronic pain in the neck-she states that oxycodone causes her to be drowsy she would like to go back to hydrocodone I cautioned her because of the acetaminophen content 6 hydrocodone would be the maximum per day she understands 3 prescriptions were given. Followup in 3 months  #2 she will need to followup for wellness exam sometime this spring with lab work  #3 increase Estrace adenosine help or go back to the previous to

## 2013-10-27 NOTE — Telephone Encounter (Signed)
Patient needs order for bloodwork for physical late February

## 2013-10-28 NOTE — Telephone Encounter (Signed)
Bloodwork ordered in the system. Patient was notified can report to lab.

## 2013-11-04 ENCOUNTER — Ambulatory Visit (INDEPENDENT_AMBULATORY_CARE_PROVIDER_SITE_OTHER): Payer: Medicare HMO | Admitting: Psychology

## 2013-11-04 DIAGNOSIS — F3289 Other specified depressive episodes: Secondary | ICD-10-CM

## 2013-11-04 DIAGNOSIS — F329 Major depressive disorder, single episode, unspecified: Secondary | ICD-10-CM

## 2013-11-04 DIAGNOSIS — F32A Depression, unspecified: Secondary | ICD-10-CM

## 2013-11-23 ENCOUNTER — Ambulatory Visit (HOSPITAL_COMMUNITY): Payer: Self-pay | Admitting: Psychiatry

## 2013-11-26 LAB — HEPATIC FUNCTION PANEL
ALBUMIN: 4.2 g/dL (ref 3.5–5.2)
ALT: 36 U/L — ABNORMAL HIGH (ref 0–35)
AST: 32 U/L (ref 0–37)
Alkaline Phosphatase: 47 U/L (ref 39–117)
BILIRUBIN DIRECT: 0.1 mg/dL (ref 0.0–0.3)
BILIRUBIN TOTAL: 0.5 mg/dL (ref 0.2–1.2)
Indirect Bilirubin: 0.4 mg/dL (ref 0.2–1.2)
Total Protein: 6.7 g/dL (ref 6.0–8.3)

## 2013-11-26 LAB — HEMOGLOBIN A1C
HEMOGLOBIN A1C: 5 % (ref <5.7)
Mean Plasma Glucose: 97 mg/dL (ref <117)

## 2013-11-26 LAB — BASIC METABOLIC PANEL
BUN: 9 mg/dL (ref 6–23)
CALCIUM: 9.4 mg/dL (ref 8.4–10.5)
CO2: 31 meq/L (ref 19–32)
Chloride: 101 mEq/L (ref 96–112)
Creat: 0.75 mg/dL (ref 0.50–1.10)
GLUCOSE: 94 mg/dL (ref 70–99)
POTASSIUM: 4.4 meq/L (ref 3.5–5.3)
SODIUM: 141 meq/L (ref 135–145)

## 2013-11-26 LAB — LIPID PANEL
Cholesterol: 183 mg/dL (ref 0–200)
HDL: 46 mg/dL (ref >39)
LDL CALC: 95 mg/dL (ref 0–99)
Total CHOL/HDL Ratio: 4 Ratio
Triglycerides: 211 mg/dL — ABNORMAL HIGH (ref <150)
VLDL: 42 mg/dL — AB (ref 0–40)

## 2013-11-28 ENCOUNTER — Encounter: Payer: Self-pay | Admitting: Family Medicine

## 2013-11-30 ENCOUNTER — Ambulatory Visit (INDEPENDENT_AMBULATORY_CARE_PROVIDER_SITE_OTHER): Payer: Medicare HMO | Admitting: Psychiatry

## 2013-11-30 ENCOUNTER — Encounter (HOSPITAL_COMMUNITY): Payer: Self-pay | Admitting: Psychiatry

## 2013-11-30 ENCOUNTER — Encounter (HOSPITAL_COMMUNITY): Payer: Self-pay | Admitting: Psychology

## 2013-11-30 VITALS — BP 180/80 | Ht 63.0 in | Wt 203.0 lb

## 2013-11-30 DIAGNOSIS — F332 Major depressive disorder, recurrent severe without psychotic features: Secondary | ICD-10-CM

## 2013-11-30 DIAGNOSIS — F32A Depression, unspecified: Secondary | ICD-10-CM

## 2013-11-30 DIAGNOSIS — F329 Major depressive disorder, single episode, unspecified: Secondary | ICD-10-CM

## 2013-11-30 MED ORDER — METHYLPHENIDATE HCL 20 MG PO TABS: 20.0000 mg | ORAL_TABLET | Freq: Three times a day (TID) | ORAL | Status: DC

## 2013-11-30 MED ORDER — GABAPENTIN 300 MG PO CAPS: 300.0000 mg | ORAL_CAPSULE | Freq: Three times a day (TID) | ORAL | Status: DC

## 2013-11-30 MED ORDER — TRAZODONE HCL 100 MG PO TABS: 100.0000 mg | ORAL_TABLET | Freq: Every evening | ORAL | Status: DC | PRN

## 2013-11-30 MED ORDER — METHYLPHENIDATE HCL ER (OSM) 54 MG PO TBCR: 54.0000 mg | EXTENDED_RELEASE_TABLET | Freq: Every day | ORAL | Status: DC

## 2013-11-30 MED ORDER — DULOXETINE HCL 60 MG PO CPEP: 60.0000 mg | ORAL_CAPSULE | Freq: Two times a day (BID) | ORAL | Status: DC

## 2013-11-30 NOTE — Progress Notes (Signed)
Patient:  Julia [name]   DOB: [birth year]  MR Number: [id number]  Location: [facility] [address] [address] Dept: [phone]  Start: 3 PM End: 4 PM  Provider/Observer:     Edgardo Roys PSYD  Chief Complaint:      Chief Complaint  Patient presents with  . Depression    Reason For Service:     The patient was referred by Dr. Harrington Challenger because of significant and ongoing issues related to depression. The patient is described as always having trouble with depression and is not sure why she is a much trouble coping. There are a lot of family stressors and other issues for her depression.  Interventions Strategy:  Cognitive/behavioral psychotherapeutic interventions  Participation Level:   Active  Participation Quality:  Appropriate      Behavioral Observation:  Well Groomed, Alert, and Appropriate.   Current Psychosocial Factors: .  Content of Session:   Reviewed current symptoms and continued work on therapeutic interventions related to issues of depression utilizing cognitive/behavioral psychotherapeutic modality.  Current Status:   Patienr reports that Ritalin is doing better with more energy and no increase in anxiety but sleep still mild issue.  Patient Progress:   Stable  Target Goals:   Target goals include reducing the intensity, severity, and duration of depressive symptoms.  Last Reviewed:   11/04/2013  Goals Addressed Today:    Today we worked on issues related to building coping skills and strategies around better management of her recurrent depression.  Impression/Diagnosis:   The patient is a long history of depression is been treated psychiatrically by Dr. Harrington Challenger most recently.  Diagnosis:    Axis I: Depression
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
## 2013-12-05 ENCOUNTER — Ambulatory Visit (HOSPITAL_COMMUNITY): Payer: Self-pay | Admitting: Psychology

## 2013-12-08 ENCOUNTER — Encounter: Payer: Medicare HMO | Admitting: Family Medicine

## 2013-12-27 ENCOUNTER — Encounter: Payer: Self-pay | Admitting: Family Medicine

## 2013-12-27 ENCOUNTER — Ambulatory Visit (INDEPENDENT_AMBULATORY_CARE_PROVIDER_SITE_OTHER): Payer: Medicare HMO | Admitting: Family Medicine

## 2013-12-27 VITALS — BP 140/90 | Ht 64.0 in | Wt 201.0 lb

## 2013-12-27 DIAGNOSIS — R748 Abnormal levels of other serum enzymes: Secondary | ICD-10-CM

## 2013-12-27 DIAGNOSIS — Z23 Encounter for immunization: Secondary | ICD-10-CM

## 2013-12-27 DIAGNOSIS — I10 Essential (primary) hypertension: Secondary | ICD-10-CM

## 2013-12-27 DIAGNOSIS — Z0000 Encounter for general adult medical examination without abnormal findings: Secondary | ICD-10-CM

## 2013-12-27 DIAGNOSIS — G8929 Other chronic pain: Secondary | ICD-10-CM

## 2013-12-27 DIAGNOSIS — M542 Cervicalgia: Secondary | ICD-10-CM

## 2013-12-27 MED ORDER — HYDROCODONE-ACETAMINOPHEN 10-325 MG PO TABS: 1.0000 | ORAL_TABLET | ORAL | Status: DC | PRN

## 2013-12-27 MED ORDER — DOXYCYCLINE HYCLATE 100 MG PO CAPS: 100.0000 mg | ORAL_CAPSULE | Freq: Two times a day (BID) | ORAL | Status: DC

## 2013-12-27 NOTE — Progress Notes (Addendum)
Subjective:    Patient ID: Julia [name], female    DOB: 05-Mar-1961, 53 y.o.   MRN: [id number]  HPI AWV- Annual Wellness Visit  The patient was seen for their annual wellness visit. The patient's past medical history, surgical history, and family history were reviewed. Pertinent vaccines were reviewed ( tetanus, pneumonia, shingles, flu) The patient's medication list was reviewed and updated. The height and weight were entered. The patient's current BMI is: 34.50  Cognitive screening was completed. Outcome of Mini - Cog: passed Falls within the past 6 months: Yes  Current tobacco usage: non smoker (All patients who use tobacco were given written and verbal information on quitting)  Recent listing of emergency department/hospitalizations over the past year were reviewed.  current specialist the patient sees on a regular basis:   Medicare annual wellness visit patient questionnaire was reviewed.  A written screening schedule for the patient for the next 5-10 years was given. Appropriate discussion of followup regarding next visit was discussed.     Review of Systems  Constitutional: Negative for activity change, appetite change and fatigue.  HENT: Negative for congestion, ear discharge and rhinorrhea.   Eyes: Negative for discharge.  Respiratory: Negative for cough, chest tightness and wheezing.   Cardiovascular: Negative for chest pain.  Gastrointestinal: Negative for vomiting and abdominal pain.  Genitourinary: Negative for frequency and difficulty urinating.  Musculoskeletal: Positive for arthralgias, back pain, myalgias and neck pain.  Neurological: Negative for weakness and headaches.  Psychiatric/Behavioral: Negative for behavioral problems and agitation.       Objective:   Physical Exam  Constitutional: She is oriented to person, place, and time. She appears well-developed and well-nourished.  HENT:  Head: Normocephalic.  Right Ear: External ear normal.  Left  Ear: External ear normal.  Eyes: Pupils are equal, round, and reactive to light.  Neck: Normal range of motion. No thyromegaly present.  Cardiovascular: Normal rate, regular rhythm, normal heart sounds and intact distal pulses.   No murmur heard. Pulmonary/Chest: Effort normal and breath sounds normal. No respiratory distress. She has no wheezes.  Breast exam normal, no masses felt  Abdominal: Soft. Bowel sounds are normal. She exhibits no distension and no mass. There is no tenderness.  Musculoskeletal: Normal range of motion. She exhibits no edema and no tenderness.  Lymphadenopathy:    She has no cervical adenopathy.  Neurological: She is alert and oriented to person, place, and time. She exhibits normal muscle tone.  Skin: Skin is warm and dry.  Psychiatric: She has a normal mood and affect. Her behavior is normal.    On physical exam gynecologic-there is no tumors noted she does have a small pustule noted below the vagina on the left but off I recommended hot compresses along with doxycycline twice a day 10 days followup if ongoing troubles      Assessment & Plan:  Patient was advised that she will need her colonoscopy in 2019 Refills on her pain medicine 3 separate scripts were given. She will need to be seen back in 3 months Annual wellness visit-dietary, safety measures discussed, tetanus shot given, medications reviewed, cognitive function is good, patient is disabled permanently., Mammogram recommended.  Should be noted that the patient is using low-dose hormone medicines. She could not tolerate being off of hormone medicines. This is helping for menopause symptoms. She states that she would be potentially willing in the future to taper off of that she is aware that it does increase her risk of breast cancer  and also heart attacks and strokes  This patient does have chronic pain with her neck. She uses hydrocodone anywhere from 3-5 times per day sometimes 6 per day she is been  responsible in taking the medication she was given 3 additional refills. If she is still living in this area she will followup with [place] otherwise she will get established with a doctor in [place] where they are moving in the summer

## 2013-12-29 ENCOUNTER — Ambulatory Visit (INDEPENDENT_AMBULATORY_CARE_PROVIDER_SITE_OTHER): Payer: Medicare HMO | Admitting: Psychiatry

## 2013-12-29 ENCOUNTER — Encounter (HOSPITAL_COMMUNITY): Payer: Self-pay | Admitting: Psychiatry

## 2013-12-29 VITALS — BP 170/90 | Ht 64.0 in | Wt 201.0 lb

## 2013-12-29 DIAGNOSIS — F329 Major depressive disorder, single episode, unspecified: Secondary | ICD-10-CM

## 2013-12-29 DIAGNOSIS — F332 Major depressive disorder, recurrent severe without psychotic features: Secondary | ICD-10-CM

## 2013-12-29 DIAGNOSIS — F32A Depression, unspecified: Secondary | ICD-10-CM

## 2013-12-29 MED ORDER — METHYLPHENIDATE HCL 20 MG PO TABS: 20.0000 mg | ORAL_TABLET | Freq: Three times a day (TID) | ORAL | Status: DC

## 2013-12-29 MED ORDER — DULOXETINE HCL 60 MG PO CPEP: 60.0000 mg | ORAL_CAPSULE | Freq: Two times a day (BID) | ORAL | Status: DC

## 2013-12-29 MED ORDER — TRAZODONE HCL 100 MG PO TABS: 100.0000 mg | ORAL_TABLET | Freq: Every evening | ORAL | Status: DC | PRN

## 2013-12-29 MED ORDER — GABAPENTIN 300 MG PO CAPS: 300.0000 mg | ORAL_CAPSULE | Freq: Three times a day (TID) | ORAL | Status: DC

## 2013-12-29 MED ORDER — GABAPENTIN 300 MG PO CAPS: 300.0000 mg | ORAL_CAPSULE | Freq: Four times a day (QID) | ORAL | Status: DC
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
## 2013-12-30 ENCOUNTER — Ambulatory Visit (HOSPITAL_COMMUNITY): Payer: Self-pay | Admitting: Psychology

## 2013-12-30 ENCOUNTER — Other Ambulatory Visit: Payer: Self-pay | Admitting: Family Medicine

## 2014-01-10 ENCOUNTER — Telehealth (HOSPITAL_COMMUNITY): Payer: Self-pay | Admitting: *Deleted

## 2014-01-10 NOTE — Telephone Encounter (Signed)
Pharmacy found them

## 2014-02-28 ENCOUNTER — Encounter (HOSPITAL_COMMUNITY): Payer: Self-pay | Admitting: Psychiatry

## 2014-02-28 ENCOUNTER — Ambulatory Visit (INDEPENDENT_AMBULATORY_CARE_PROVIDER_SITE_OTHER): Payer: Medicare HMO | Admitting: Psychiatry

## 2014-02-28 VITALS — BP 140/100 | Ht 64.0 in | Wt 202.0 lb

## 2014-02-28 DIAGNOSIS — F329 Major depressive disorder, single episode, unspecified: Secondary | ICD-10-CM

## 2014-02-28 DIAGNOSIS — F332 Major depressive disorder, recurrent severe without psychotic features: Secondary | ICD-10-CM

## 2014-02-28 DIAGNOSIS — F32A Depression, unspecified: Secondary | ICD-10-CM

## 2014-02-28 MED ORDER — TRAZODONE HCL 100 MG PO TABS: 100.0000 mg | ORAL_TABLET | Freq: Every evening | ORAL | Status: DC | PRN

## 2014-02-28 MED ORDER — GABAPENTIN 600 MG PO TABS: 600.0000 mg | ORAL_TABLET | Freq: Three times a day (TID) | ORAL | Status: DC

## 2014-02-28 MED ORDER — LISDEXAMFETAMINE DIMESYLATE 50 MG PO CAPS: 50.0000 mg | ORAL_CAPSULE | ORAL | Status: DC

## 2014-02-28 MED ORDER — DULOXETINE HCL 60 MG PO CPEP: 60.0000 mg | ORAL_CAPSULE | Freq: Two times a day (BID) | ORAL | Status: DC

## 2014-02-28 MED ORDER — LISDEXAMFETAMINE DIMESYLATE 50 MG PO CAPS: 50.0000 mg | ORAL_CAPSULE | Freq: Every day | ORAL | Status: DC
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
## 2014-03-28 ENCOUNTER — Ambulatory Visit (INDEPENDENT_AMBULATORY_CARE_PROVIDER_SITE_OTHER): Payer: Medicare HMO | Admitting: Psychiatry

## 2014-03-28 ENCOUNTER — Encounter (HOSPITAL_COMMUNITY): Payer: Self-pay | Admitting: Psychiatry

## 2014-03-28 VITALS — BP 140/90 | Ht 64.0 in | Wt 203.0 lb

## 2014-03-28 DIAGNOSIS — F32A Depression, unspecified: Secondary | ICD-10-CM

## 2014-03-28 DIAGNOSIS — F329 Major depressive disorder, single episode, unspecified: Secondary | ICD-10-CM

## 2014-03-28 DIAGNOSIS — F332 Major depressive disorder, recurrent severe without psychotic features: Secondary | ICD-10-CM

## 2014-03-28 MED ORDER — DULOXETINE HCL 60 MG PO CPEP: 60.0000 mg | ORAL_CAPSULE | Freq: Two times a day (BID) | ORAL | Status: DC

## 2014-03-28 MED ORDER — LISDEXAMFETAMINE DIMESYLATE 50 MG PO CAPS: 50.0000 mg | ORAL_CAPSULE | ORAL | Status: DC

## 2014-03-28 MED ORDER — LISDEXAMFETAMINE DIMESYLATE 50 MG PO CAPS: 50.0000 mg | ORAL_CAPSULE | Freq: Every day | ORAL | Status: DC

## 2014-03-28 MED ORDER — GABAPENTIN 600 MG PO TABS: 600.0000 mg | ORAL_TABLET | Freq: Three times a day (TID) | ORAL | Status: DC

## 2014-03-28 MED ORDER — TRAZODONE HCL 100 MG PO TABS: 100.0000 mg | ORAL_TABLET | Freq: Every evening | ORAL | Status: DC | PRN
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
## 2014-03-29 ENCOUNTER — Encounter (HOSPITAL_COMMUNITY): Payer: Self-pay | Admitting: Psychiatry

## 2014-03-29 ENCOUNTER — Ambulatory Visit (INDEPENDENT_AMBULATORY_CARE_PROVIDER_SITE_OTHER): Payer: Medicare HMO | Admitting: Psychiatry

## 2014-03-29 DIAGNOSIS — F339 Major depressive disorder, recurrent, unspecified: Secondary | ICD-10-CM

## 2014-03-29 NOTE — Patient Instructions (Signed)
Discussed orally 
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
## 2014-04-19 ENCOUNTER — Ambulatory Visit (HOSPITAL_COMMUNITY): Payer: Self-pay | Admitting: Psychiatry

## 2014-04-24 ENCOUNTER — Other Ambulatory Visit: Payer: Self-pay | Admitting: *Deleted

## 2014-04-24 ENCOUNTER — Telehealth: Payer: Self-pay | Admitting: Family Medicine

## 2014-04-24 MED ORDER — LISINOPRIL 5 MG PO TABS: 5.0000 mg | ORAL_TABLET | Freq: Every day | ORAL | Status: DC

## 2014-04-24 MED ORDER — HYDROCODONE-ACETAMINOPHEN 10-325 MG PO TABS: 1.0000 | ORAL_TABLET | ORAL | Status: DC | PRN

## 2014-04-24 NOTE — Telephone Encounter (Signed)
HYDROcodone-acetaminophen (NORCO) 10-325 MG per tablet  Pt has an appt with [place] 23 July   But only has 5 days left of this med, can we issues enough to get  Her through till the appt.   She states dues to [facility] she was unable to make it in sooner

## 2014-04-24 NOTE — Telephone Encounter (Signed)
Script ready for pickup. Pt notified.  

## 2014-04-24 NOTE — Telephone Encounter (Signed)
Last see 12/27/13

## 2014-04-24 NOTE — Telephone Encounter (Signed)
1 refill, patient must come in for office visit sometime in the next 2-3 weeks

## 2014-05-02 ENCOUNTER — Ambulatory Visit (INDEPENDENT_AMBULATORY_CARE_PROVIDER_SITE_OTHER): Payer: Medicare HMO | Admitting: Psychiatry

## 2014-05-02 DIAGNOSIS — F331 Major depressive disorder, recurrent, moderate: Secondary | ICD-10-CM

## 2014-05-02 NOTE — Patient Instructions (Signed)
Discussed orally 

## 2014-05-02 NOTE — Progress Notes (Signed)
   THERAPIST PROGRESS NOTE  Session Time: Tuesday 05/02/2014 2:10 PM - 2:55 PM  Participation Level: Active  Behavioral Response: CasualAlert/less depressed/less anxious  Type of Therapy: Individual Therapy  Treatment Goals addressed: Establish therapeutic alliance, improve ability to manage stress and anxiety  Interventions: Supportive  Summary: JETT KULZER is a 53 y.o. female who presents with a long standing history of recurrent periods of depression beginning in adolescence. She has had 3 psychiatric hospitalizations due to suicidal attempts by pill overdose with the last one occuring in 2006. She reports beginning to experience suicidal thoughts again last year but deciding to seek professional help rather than overdose. She began seeing Dr. Harrington Challenger in September 2014 and has seen Dr. Sima Matas for therapy. Patient reports improved mood and no longer experiencing suicidal ideations. However, she continues to experience stress related to her physical health and relationship with family. She reports having no energy due to diagnosis of fibromyalgia. She states sometimes being overwhelmed with family members always coming to her for help. She reports being in this role since childhood as she is the oldest of three siblings and provided care for siblings due to mother's behavior..   Patient states being "pretty good" since last session. Per her report, she feels better and has increased energy since taking medication as prescribed by psychiatrist Dr. Harrington Challenger. However, she reports becoming agitated over things that she thinks shouldn't be upsetting. Her main stress in the past two weeks is phone calls from her father about 10 times per day. Most of the calls are about minor issues. Patient has tried to set boundaries with father and wait to return calls but feels extremely guilty. Patient also reports other family members still call frequently and expect her to "fix it". She states being tired of always  being seen as the one to fix it. She shares more information from childhood, the relationship with her mother, and the effects on her current functioning.   Suicidal/Homicidal: No  Therapist Response: Therapist works with patient to process feelings, gather more information regarding childhood history, discuss boundary issues in patient's relationships, began to examine thought patterns and effects on mood and behavior  Plan: Return again in 2 weeks.  Diagnosis: Axis I: MDD, Recurrent, Moderate    Axis II: Deferred    Oswald Pott, LCSW 05/02/2014

## 2014-05-04 ENCOUNTER — Ambulatory Visit (INDEPENDENT_AMBULATORY_CARE_PROVIDER_SITE_OTHER): Payer: Medicare HMO | Admitting: Family Medicine

## 2014-05-04 ENCOUNTER — Encounter: Payer: Self-pay | Admitting: Family Medicine

## 2014-05-04 VITALS — BP 148/94 | Resp 18 | Ht 64.0 in | Wt 193.0 lb

## 2014-05-04 DIAGNOSIS — IMO0001 Reserved for inherently not codable concepts without codable children: Secondary | ICD-10-CM

## 2014-05-04 DIAGNOSIS — L930 Discoid lupus erythematosus: Secondary | ICD-10-CM

## 2014-05-04 DIAGNOSIS — R61 Generalized hyperhidrosis: Secondary | ICD-10-CM

## 2014-05-04 DIAGNOSIS — M542 Cervicalgia: Secondary | ICD-10-CM

## 2014-05-04 DIAGNOSIS — G8929 Other chronic pain: Secondary | ICD-10-CM

## 2014-05-04 LAB — TSH: TSH: 0.649 u[IU]/mL (ref 0.350–4.500)

## 2014-05-04 LAB — T4, FREE: Free T4: 0.93 ng/dL (ref 0.80–1.80)

## 2014-05-04 MED ORDER — HYDROCODONE-ACETAMINOPHEN 10-325 MG PO TABS: 1.0000 | ORAL_TABLET | ORAL | Status: DC | PRN

## 2014-05-04 MED ORDER — MOMETASONE FUROATE 0.1 % EX CREA: TOPICAL_CREAM | CUTANEOUS | Status: DC

## 2014-05-04 NOTE — Progress Notes (Signed)
   Subjective:    Patient ID: Julia [name], female    DOB: 09-28-1961, 53 y.o.   MRN: [id number]  HPI Patient here today for a med check & concerns about a questionable Lupus lesion  Also has hot spells ( didn't check fever) no chills, sweats with it No night sweats, appetite fair  This patient was seen today for chronic pain  The medication list was reviewed and updated.   -Compliance with pain medication: Good  The patient was advised the importance of maintaining medication and not using illegal substances with these.  Refills needed: Yes  The patient was educated that we can provide 3 monthly scripts for their medication, it is their responsibility to follow the instructions.  Side effects or complications from medications: None  Patient is aware that pain medications are meant to minimize the severity of the pain to allow their pain levels to improve to allow for better function. They are aware of that pain medications cannot totally remove their pain.  Due for UDT ( at least once per year) : Later this year       Review of Systems Denies high fever chills sweats currently she does state at times she feels hot and will get sweaty in the face but otherwise normal no weight loss. She relates chronic neck pain.    Objective:   Physical Exam probable discoid lupus  Lungs clear heart regular pulse normal subjective discomfort in her neck       Assessment & Plan:  Chronic neck pain prescriptions were written followup in 3 months  She is looking to move to [place] not sure if this will occur in a few months or in 6 months  The area on her arm probably related to discoid lupus but it's getting worse she will go ahead and see her dermatologist use Elocon cream twice a day when necessary

## 2014-05-04 NOTE — Progress Notes (Signed)
Patient here today for a med check & concerns about a questionable Lupus lesion.

## 2014-05-10 ENCOUNTER — Encounter: Payer: Self-pay | Admitting: Family Medicine

## 2014-05-10 NOTE — Telephone Encounter (Signed)
I called patient, and she said that she didn't have a question about her TSH. She said if she does, she would let us know.

## 2014-05-18 ENCOUNTER — Encounter (HOSPITAL_COMMUNITY): Payer: Self-pay | Admitting: Emergency Medicine

## 2014-05-18 ENCOUNTER — Emergency Department (HOSPITAL_COMMUNITY)
Admission: EM | Admit: 2014-05-18 | Discharge: 2014-05-18 | Disposition: A | Discharge status: Home / Self Care | Payer: Medicare HMO | Attending: Emergency Medicine | Admitting: Emergency Medicine

## 2014-05-18 ENCOUNTER — Ambulatory Visit (INDEPENDENT_AMBULATORY_CARE_PROVIDER_SITE_OTHER): Payer: Medicare HMO | Admitting: Psychiatry

## 2014-05-18 DIAGNOSIS — Z872 Personal history of diseases of the skin and subcutaneous tissue: Secondary | ICD-10-CM | POA: Diagnosis not present

## 2014-05-18 DIAGNOSIS — Z79899 Other long term (current) drug therapy: Secondary | ICD-10-CM | POA: Diagnosis not present

## 2014-05-18 DIAGNOSIS — M130 Polyarthritis, unspecified: Secondary | ICD-10-CM | POA: Diagnosis not present

## 2014-05-18 DIAGNOSIS — IMO0002 Reserved for concepts with insufficient information to code with codable children: Secondary | ICD-10-CM | POA: Diagnosis not present

## 2014-05-18 DIAGNOSIS — Z87442 Personal history of urinary calculi: Secondary | ICD-10-CM | POA: Diagnosis not present

## 2014-05-18 DIAGNOSIS — F331 Major depressive disorder, recurrent, moderate: Secondary | ICD-10-CM

## 2014-05-18 DIAGNOSIS — M542 Cervicalgia: Secondary | ICD-10-CM | POA: Insufficient documentation

## 2014-05-18 DIAGNOSIS — F17200 Nicotine dependence, unspecified, uncomplicated: Secondary | ICD-10-CM | POA: Insufficient documentation

## 2014-05-18 DIAGNOSIS — Z85828 Personal history of other malignant neoplasm of skin: Secondary | ICD-10-CM | POA: Diagnosis not present

## 2014-05-18 DIAGNOSIS — G43809 Other migraine, not intractable, without status migrainosus: Secondary | ICD-10-CM | POA: Diagnosis not present

## 2014-05-18 DIAGNOSIS — G43909 Migraine, unspecified, not intractable, without status migrainosus: Secondary | ICD-10-CM | POA: Insufficient documentation

## 2014-05-18 DIAGNOSIS — I10 Essential (primary) hypertension: Secondary | ICD-10-CM | POA: Insufficient documentation

## 2014-05-18 DIAGNOSIS — Z792 Long term (current) use of antibiotics: Secondary | ICD-10-CM | POA: Diagnosis not present

## 2014-05-18 MED ORDER — DEXAMETHASONE SODIUM PHOSPHATE 10 MG/ML IJ SOLN
10.0000 mg | Freq: Once | INTRAMUSCULAR | Status: AC
  Administered 2014-05-18: 10 mg via INTRAVENOUS
  Filled 2014-05-18: qty 1

## 2014-05-18 MED ORDER — KETOROLAC TROMETHAMINE 30 MG/ML IJ SOLN
30.0000 mg | Freq: Once | INTRAMUSCULAR | Status: AC
  Administered 2014-05-18: 30 mg via INTRAVENOUS
  Filled 2014-05-18: qty 1

## 2014-05-18 MED ORDER — SODIUM CHLORIDE 0.9 % IV BOLUS (SEPSIS)
1000.0000 mL | Freq: Once | INTRAVENOUS | Status: AC
  Administered 2014-05-18: 1000 mL via INTRAVENOUS

## 2014-05-18 MED ORDER — METOCLOPRAMIDE HCL 5 MG/ML IJ SOLN
10.0000 mg | Freq: Once | INTRAMUSCULAR | Status: AC
  Administered 2014-05-18: 10 mg via INTRAVENOUS
  Filled 2014-05-18: qty 2

## 2014-05-18 MED ORDER — DIPHENHYDRAMINE HCL 50 MG/ML IJ SOLN
25.0000 mg | Freq: Once | INTRAMUSCULAR | Status: AC
  Administered 2014-05-18: 25 mg via INTRAVENOUS
  Filled 2014-05-18: qty 1

## 2014-05-18 NOTE — Patient Instructions (Signed)
Discussed orally 

## 2014-05-18 NOTE — ED Provider Notes (Signed)
CSN: [id number]     Arrival date & time 05/18/14  1534 History   This chart was scribed for Veryl Speak, MD by Erling Conte, ED Scribe. This patient was seen in room APA04/APA04 and the patient's care was started at 3:52 PM.    Chief Complaint  Patient presents with  . Migraine      The history is provided by the patient. No language interpreter was used.    HPI Comments: Julia [name] is a 53 y.o. female with a h/o HTN, skin cancer, nephrolithiasis, chronic headaches, polyarthritis, and discoid lupus who presents to the Emergency Department complaining of a constant, moderate, "9/10",  right sided migraine headache for 1 week. Pt states she is having associated blurred vision in right eye, photophobia, sensitivity to sound, and neck pain. Patient notes she previously had surgery on her neck 3 years ago. She denies any head injury or trauma. She states she has h/o of chronic migraines and this feels like a typical migraine. She states she has been taking Vicodin and Neurontin with mild relief. She denies any fever, chills, abdominal pain, emesis, weakness, numbness, or nausea.     Past Medical History  Diagnosis Date  . Hypertension   . Cancer     skin  . Kidney stones   . Headache(784.0)   . Polyarthritis   . Discoid lupus   . Fibromyalgia    Past Surgical History  Procedure Laterality Date  . Abdominal hysterectomy    . Cholecystectomy    . Appendectomy    . Neck surgery    . Eye surgery    . Ratator cuff     Family History  Problem Relation Age of Onset  . Bipolar disorder Mother   . Alcohol abuse Mother   . Drug abuse Mother   . Depression Father   . Dementia Father   . Alcohol abuse Father   . Drug abuse Father   . Anxiety disorder Brother   . Paranoid behavior Brother   . Alcohol abuse Brother   . Drug abuse Brother    History  Substance Use Topics  . Smoking status: Current Every Day Smoker -- 0.50 packs/day  . Smokeless tobacco: Never Used  .  Alcohol Use: No     Comment: mariijuana [place] 2-3 times per week, occasional beer use   OB History   Grav Para Term Preterm Abortions TAB SAB Ect Mult Living                 Review of Systems  Constitutional: Negative for fever and chills.  Eyes: Positive for photophobia and visual disturbance (right eye).  Gastrointestinal: Negative for nausea, vomiting and abdominal pain.  Musculoskeletal: Positive for neck pain (had neck surgery in 2012).  Neurological: Positive for headaches (migraine). Negative for weakness and numbness.  A complete 10 system review of systems was obtained and all systems are negative except as noted in the HPI and PMH.      Allergies  E-mycin and Erythromycin  Home Medications   Prior to Admission medications   Medication Sig Start Date End Date Taking? Authorizing Provider  cyclobenzaprine (FLEXERIL) 10 MG tablet Take 10 mg by mouth at bedtime as needed. For pain 06/07/13   Kathyrn Drown, MD  doxycycline (VIBRAMYCIN) 100 MG capsule Take 1 capsule (100 mg total) by mouth 2 (two) times daily. 12/27/13   Kathyrn Drown, MD  DULoxetine (CYMBALTA) 60 MG capsule Take 1 capsule (60 mg total) by mouth  2 (two) times daily. 03/28/14 03/28/15  Levonne Spiller, MD  estradiol (ESTRACE) 1 MG tablet Take 1 tablet (1 mg total) by mouth daily. 10/27/13   Kathyrn Drown, MD  gabapentin (NEURONTIN) 600 MG tablet Take 1 tablet (600 mg total) by mouth 3 (three) times daily. 03/28/14 03/28/15  Levonne Spiller, MD  HYDROcodone-acetaminophen (NORCO) 10-325 MG per tablet Take 1 tablet by mouth every 4 (four) hours as needed. 05/04/14   Kathyrn Drown, MD  hydroxychloroquine (PLAQUENIL) 200 MG tablet Take 200 mg by mouth Daily. 05/03/12   Historical Provider, MD  lisdexamfetamine (VYVANSE) 50 MG capsule Take 1 capsule (50 mg total) by mouth every morning. 03/28/14   Levonne Spiller, MD  lisdexamfetamine (VYVANSE) 50 MG capsule Take 1 capsule (50 mg total) by mouth daily. 03/28/14   Levonne Spiller, MD   lisdexamfetamine (VYVANSE) 50 MG capsule Take 1 capsule (50 mg total) by mouth daily. 03/28/14   Levonne Spiller, MD  lisinopril (PRINIVIL,ZESTRIL) 5 MG tablet Take 1 tablet (5 mg total) by mouth daily. 04/24/14   Kathyrn Drown, MD  mometasone (ELOCON) 0.1 % cream Apply to affected area bid 05/04/14 05/04/15  Kathyrn Drown, MD  Multiple Vitamin (MULTIVITAMIN WITH MINERALS) TABS Take 1 tablet by mouth daily.    Historical Provider, MD  traZODone (DESYREL) 100 MG tablet Take 1 tablet (100 mg total) by mouth at bedtime as needed and may repeat dose one time if needed. 03/28/14   Levonne Spiller, MD  VENTOLIN HFA 108 (90 BASE) MCG/ACT inhaler Inhale 1-2 puffs into the lungs Once daily as needed. For shortness of breath 03/16/12   Historical Provider, MD   Triage Vitals: BP 170/70  Pulse 62  Temp(Src) 98.3 F (36.8 C) (Oral)  Resp 16  Ht 5\' 4"  (1.626 m)  Wt 193 lb (87.544 kg)  BMI 33.11 kg/m2  SpO2 96%  Physical Exam  Nursing note and vitals reviewed. Constitutional: She is oriented to person, place, and time. She appears well-developed and well-nourished. No distress.  HENT:  Head: Normocephalic and atraumatic.  Eyes: Conjunctivae and EOM are normal. Pupils are equal, round, and reactive to light.  No papilledema.   Neck: Neck supple. No tracheal deviation present.  Cardiovascular: Normal rate.   Pulmonary/Chest: Effort normal. No respiratory distress.  Musculoskeletal: Normal range of motion.  Neurological: She is alert and oriented to person, place, and time. No cranial nerve deficit. She exhibits normal muscle tone. Coordination normal.  Skin: Skin is warm and dry.  Psychiatric: She has a normal mood and affect. Her behavior is normal.    ED Course  Procedures (including critical care time)  DIAGNOSTIC STUDIES: Oxygen Saturation is 96% on RA, adequate by my interpretation.    COORDINATION OF CARE: 4:03 PM- Will order NaCl bolus, Toradol, Reglan, Benadryl, Decadron.  Pt advised of plan  for treatment and pt agrees.   Labs Review Labs Reviewed - No data to display  Imaging Review No results found.   EKG Interpretation None      MDM   Final diagnoses:  None    Patient feels better with migraine cocktail. Will discharge to home. Her physical examination is unremarkable and neurologic exam is nonfocal. I see no indication for CT, or LP. She understands to return if her symptoms worsen or change  I personally performed the services described in this documentation, which was scribed in my presence. The recorded information has been reviewed and is accurate.       Veryl Speak,  MD 05/18/14 1731

## 2014-05-18 NOTE — Discharge Instructions (Signed)
Continue your medications as before.  Return to the emergency department if you develop severe headache, high fever, stiff neck, or other new and concerning symptoms.   Migraine Headache A migraine headache is an intense, throbbing pain on one or both sides of your head. A migraine can last for 30 minutes to several hours. CAUSES  The exact cause of a migraine headache is not always known. However, a migraine may be caused when nerves in the brain become irritated and release chemicals that cause inflammation. This causes pain. Certain things may also trigger migraines, such as:  Alcohol.  Smoking.  Stress.  Menstruation.  Aged cheeses.  Foods or drinks that contain nitrates, glutamate, aspartame, or tyramine.  Lack of sleep.  Chocolate.  Caffeine.  Hunger.  Physical exertion.  Fatigue.  Medicines used to treat chest pain (nitroglycerine), birth control pills, estrogen, and some blood pressure medicines. SIGNS AND SYMPTOMS  Pain on one or both sides of your head.  Pulsating or throbbing pain.  Severe pain that prevents daily activities.  Pain that is aggravated by any physical activity.  Nausea, vomiting, or both.  Dizziness.  Pain with exposure to bright lights, loud noises, or activity.  General sensitivity to bright lights, loud noises, or smells. Before you get a migraine, you may get warning signs that a migraine is coming (aura). An aura may include:  Seeing flashing lights.  Seeing bright spots, halos, or zigzag lines.  Having tunnel vision or blurred vision.  Having feelings of numbness or tingling.  Having trouble talking.  Having muscle weakness. DIAGNOSIS  A migraine headache is often diagnosed based on:  Symptoms.  Physical exam.  A CT scan or MRI of your head. These imaging tests cannot diagnose migraines, but they can help rule out other causes of headaches. TREATMENT Medicines may be given for pain and nausea. Medicines can  also be given to help prevent recurrent migraines.  HOME CARE INSTRUCTIONS  Only take over-the-counter or prescription medicines for pain or discomfort as directed by your health care provider. The use of long-term narcotics is not recommended.  Lie down in a dark, quiet room when you have a migraine.  Keep a journal to find out what may trigger your migraine headaches. For example, write down:  What you eat and drink.  How much sleep you get.  Any change to your diet or medicines.  Limit alcohol consumption.  Quit smoking if you smoke.  Get 7-9 hours of sleep, or as recommended by your health care provider.  Limit stress.  Keep lights dim if bright lights bother you and make your migraines worse. SEEK IMMEDIATE MEDICAL CARE IF:   Your migraine becomes severe.  You have a fever.  You have a stiff neck.  You have vision loss.  You have muscular weakness or loss of muscle control.  You start losing your balance or have trouble walking.  You feel faint or pass out.  You have severe symptoms that are different from your first symptoms. MAKE SURE YOU:   Understand these instructions.  Will watch your condition.  Will get help right away if you are not doing well or get worse. Document Released: 09/29/2005 Document Revised: 02/13/2014 Document Reviewed: 06/06/2013 [facility] Patient Information 2015 [place], [place]. This information is not intended to replace advice given to you by your health care provider. Make sure you discuss any questions you have with your health care provider.

## 2014-05-18 NOTE — Progress Notes (Signed)
   THERAPIST PROGRESS NOTE  Session Time:  Thursday 05/18/2014 2:05 PM -2:55 PM  Participation Level: Active  Behavioral Response: CasualAlertAnxious and Depressed  Type of Therapy: Individual Therapy  Treatment Goals addressed:  Improve assertiveness skills and the ability to set/maintain boundaries      Improve ability to manage stress and anxiety  Interventions: CBT and Supportive  Summary: Julia [name] is a 53 y.o. female who presents with a long standing history of recurrent periods of depression beginning in adolescence. She has had 3 psychiatric hospitalizations due to suicidal attempts by pill overdose with the last one occuring in 2006. She reports beginning to experience suicidal thoughts again last year but deciding to seek professional help rather than overdose. She began seeing Dr. Harrington Challenger in September 2014 and has seen Dr. Sima Matas for therapy. Patient reports improved mood and no longer experiencing suicidal ideations. However, she continues to experience stress related to her physical health and relationship with family. She reports having no energy due to diagnosis of fibromyalgia. She states sometimes being overwhelmed with family members always coming to her for help. She reports being in this role since childhood as she is the oldest of three siblings and provided care for siblings due to mother's behavior..   Patient reports continued stress and anxiety related to family members continuing to have expectations from patient regarding complying with their requests. She expresses frustration and resentment regarding father constantly calling. She has difficulty saying no as she feels guilty and fears disapproval. She also has been experiencing increased pain and migraines.    Suicidal/Homicidal: No  Therapist Response: Therapist works with patient to process feelings, develop treatment plan, identify thoughts that block and help self-assertion, and review relaxation  technique.  Plan: Return again in 3 weeks.  Diagnosis: Axis I: MDD    Axis II: Deferred    Julia Evelyn, LCSW 05/18/2014

## 2014-05-18 NOTE — ED Notes (Signed)
Pt c/o migraine x 1 week with n/sensitiveity to light/sound. Pt also c/o pain to neck and upper back.

## 2014-06-08 ENCOUNTER — Ambulatory Visit (HOSPITAL_COMMUNITY): Payer: Self-pay | Admitting: Psychiatry

## 2014-06-21 ENCOUNTER — Telehealth (HOSPITAL_COMMUNITY): Payer: Self-pay | Admitting: *Deleted

## 2014-06-21 ENCOUNTER — Ambulatory Visit (INDEPENDENT_AMBULATORY_CARE_PROVIDER_SITE_OTHER): Payer: Medicare HMO | Admitting: Family Medicine

## 2014-06-21 ENCOUNTER — Encounter: Payer: Self-pay | Admitting: Family Medicine

## 2014-06-21 VITALS — BP 124/84 | Temp 98.3°F | Ht 64.0 in | Wt 196.4 lb

## 2014-06-21 DIAGNOSIS — H9209 Otalgia, unspecified ear: Secondary | ICD-10-CM

## 2014-06-21 DIAGNOSIS — H9202 Otalgia, left ear: Secondary | ICD-10-CM

## 2014-06-21 MED ORDER — CEFPROZIL 500 MG PO TABS: 500.0000 mg | ORAL_TABLET | Freq: Two times a day (BID) | ORAL | Status: DC

## 2014-06-21 MED ORDER — HYDROCODONE-ACETAMINOPHEN 10-325 MG PO TABS: 1.0000 | ORAL_TABLET | ORAL | Status: DC | PRN

## 2014-06-21 MED ORDER — NEOMYCIN-POLYMYXIN-HC 1 % OT SOLN: 4.0000 [drp] | Freq: Four times a day (QID) | OTIC | Status: DC

## 2014-06-21 MED ORDER — CEFTRIAXONE SODIUM 1 G IJ SOLR
1.0000 g | Freq: Once | INTRAMUSCULAR | Status: AC
  Administered 2014-06-21: 1 g via INTRAMUSCULAR

## 2014-06-21 NOTE — Telephone Encounter (Signed)
Pt pharmacy requesting refills for pt Duloxetine DR 60 mg. Pt have upcoming appt 06-28-14. Pt was last in 03-28-14.

## 2014-06-21 NOTE — Telephone Encounter (Signed)
yes

## 2014-06-21 NOTE — Progress Notes (Signed)
   Subjective:    Patient ID: Julia [name], female    DOB: 1960/11/17, 53 y.o.   MRN: [id number]  Otalgia  There is pain in the left ear. This is a new problem. The current episode started 1 to 4 weeks ago. The problem occurs constantly. The problem has been unchanged. The maximum temperature recorded prior to her arrival was 101 - 101.9 F. The pain is at a severity of 9/10. The pain is moderate. Associated symptoms include coughing, headaches and a sore throat. Treatments tried: nyquil. The treatment provided no relief.   Patient has a spot on her right lower leg she wants the doctor to take a look at.    Review of Systems  HENT: Positive for ear pain and sore throat.   Respiratory: Positive for cough.   Neurological: Positive for headaches.       Objective:   Physical Exam  Left otalgia noted with otitis media. Throat is normal neck is supple lungs are clear heart is regular she does have some tenderness in the right cheek but no obvious cellulitis is seen no sign of MRSA seen      Assessment & Plan:  Otitis media -- I do recommend a shot of antibiotics as well as oral antibiotics patient also states she would like to see ENT she has had persistent trouble for several months with feeling full and pressure possibly might need tube, she prefers [facility]  Spinal no lower leg looks slightly worse small bowel bite was I see no major ulcerations only a couple millimeters in diameter at its worse followup

## 2014-06-22 ENCOUNTER — Telehealth (HOSPITAL_COMMUNITY): Payer: Self-pay | Admitting: *Deleted

## 2014-06-22 ENCOUNTER — Telehealth: Payer: Self-pay | Admitting: Family Medicine

## 2014-06-22 ENCOUNTER — Other Ambulatory Visit: Payer: Self-pay | Admitting: *Deleted

## 2014-06-22 ENCOUNTER — Other Ambulatory Visit (HOSPITAL_COMMUNITY): Payer: Self-pay | Admitting: *Deleted

## 2014-06-22 MED ORDER — DULOXETINE HCL 60 MG PO CPEP: 60.0000 mg | ORAL_CAPSULE | Freq: Two times a day (BID) | ORAL | Status: DC

## 2014-06-22 MED ORDER — DOXYCYCLINE HYCLATE 100 MG PO TABS: 100.0000 mg | ORAL_TABLET | Freq: Two times a day (BID) | ORAL | Status: DC

## 2014-06-22 MED ORDER — PROMETHAZINE HCL 25 MG RE SUPP: 25.0000 mg | RECTAL | Status: DC | PRN

## 2014-06-22 NOTE — Telephone Encounter (Signed)
Sent rx to pharmacy

## 2014-06-22 NOTE — Telephone Encounter (Signed)
Talked with patient and she states that the swelling is on the right side of her face. Advised patient that we will send in Doxycycline and Phenergan suppositories to pharmacy. Need dosage and directions please.

## 2014-06-22 NOTE — Telephone Encounter (Signed)
Resend it to pharmacy

## 2014-06-22 NOTE — Telephone Encounter (Signed)
Left message on voicemail to return call.

## 2014-06-22 NOTE — Telephone Encounter (Signed)
Doxycycline 100r milligrams twice a day for 10 days, take doxycycline with a snack and plenty of water, Phenergan 25 mg suppositories every 4 hours when necessary nausea #8 one refill, if ongoing troubles followup here or in ER

## 2014-06-22 NOTE — Telephone Encounter (Addendum)
Discussed with pt. meds sent to pharm.  

## 2014-06-22 NOTE — Telephone Encounter (Signed)
Pt pharmacy is requesting Pt Duloxetine 60 mg BID. Pt medication was last filled 03-28-14 with 60 capsules with 2 refills. Pt last appt was 03-28-14 and has an upcoming appt for 06-28-14.

## 2014-06-22 NOTE — Telephone Encounter (Signed)
Per Dr.Ross to fill pt Duloxetine for one month worth to last her until her upcoming appt

## 2014-06-22 NOTE — Telephone Encounter (Signed)
Sent pt rx to pharmacy

## 2014-06-22 NOTE — Telephone Encounter (Signed)
Pt was given rocephin shot yesterday at [facility], issued cefPROZIL (CEFZIL) 500 MG tablet  She is now vomiting, can't hold anything down to include her meds. Left side of face is  Swollen more today than yesterday.   Please advise   Felecia Shelling

## 2014-06-26 ENCOUNTER — Other Ambulatory Visit: Payer: Self-pay | Admitting: Family Medicine

## 2014-06-27 ENCOUNTER — Telehealth: Payer: Self-pay | Admitting: Family Medicine

## 2014-06-27 MED ORDER — AMOXICILLIN-POT CLAVULANATE ER 1000-62.5 MG PO TB12: 2.0000 | ORAL_TABLET | Freq: Two times a day (BID) | ORAL | Status: DC

## 2014-06-27 NOTE — Telephone Encounter (Signed)
Current symptoms: ear hurts, stopped up, congested  No more face swelling or cellulitis  Took all meds administered to her from last visit on 06/21/14.   Last telephone message was as follows:   Kathyrn Drown, MD at 06/22/2014 11:41 AM     Status: Signed        Doxycycline 100r milligrams twice a day for 10 days, take doxycycline with a snack and plenty of water, Phenergan 25 mg suppositories every 4 hours when necessary nausea #8 one refill, if ongoing troubles followup here or in [place]

## 2014-06-27 NOTE — Telephone Encounter (Signed)
Patient states she was already going to have referral to ENT- Doesn't think doxy helped with her ear-it fixed her face but not her ear. Patient states she is having pain and pressure in ear.

## 2014-06-27 NOTE — Telephone Encounter (Signed)
Rx sent electronically to pharmacy. Patient notified. 

## 2014-06-27 NOTE — Telephone Encounter (Signed)
Please call the patient verifies symptoms discussed with them. Certainly if doing worse we would need to recheck her otherwise-May do one additional round of doxycycline. If this still does not totally take care of the problem next that would be referral to ENT

## 2014-06-27 NOTE — Telephone Encounter (Signed)
augmentin 1gram , 2po bid 10 days, f/u ov if worse , please put referral into system and send to [facility] as urgent

## 2014-06-28 ENCOUNTER — Encounter: Payer: Self-pay | Admitting: Family Medicine

## 2014-06-28 ENCOUNTER — Ambulatory Visit (INDEPENDENT_AMBULATORY_CARE_PROVIDER_SITE_OTHER): Payer: Medicare HMO | Admitting: Psychiatry

## 2014-06-28 ENCOUNTER — Encounter (HOSPITAL_COMMUNITY): Payer: Self-pay | Admitting: Psychiatry

## 2014-06-28 VITALS — BP 144/96 | HR 64 | Ht 64.0 in | Wt 194.6 lb

## 2014-06-28 DIAGNOSIS — F331 Major depressive disorder, recurrent, moderate: Secondary | ICD-10-CM

## 2014-06-28 DIAGNOSIS — F332 Major depressive disorder, recurrent severe without psychotic features: Secondary | ICD-10-CM

## 2014-06-28 MED ORDER — LISDEXAMFETAMINE DIMESYLATE 50 MG PO CAPS: 50.0000 mg | ORAL_CAPSULE | Freq: Every day | ORAL | Status: DC

## 2014-06-28 MED ORDER — LISDEXAMFETAMINE DIMESYLATE 50 MG PO CAPS: 50.0000 mg | ORAL_CAPSULE | ORAL | Status: DC

## 2014-06-28 MED ORDER — TRAZODONE HCL 100 MG PO TABS: 100.0000 mg | ORAL_TABLET | Freq: Every evening | ORAL | Status: DC | PRN

## 2014-06-28 MED ORDER — DULOXETINE HCL 60 MG PO CPEP: 60.0000 mg | ORAL_CAPSULE | Freq: Two times a day (BID) | ORAL | Status: DC

## 2014-06-28 MED ORDER — GABAPENTIN 600 MG PO TABS: 600.0000 mg | ORAL_TABLET | Freq: Three times a day (TID) | ORAL | Status: DC

## 2014-06-28 MED ORDER — LISDEXAMFETAMINE DIMESYLATE 50 MG PO CAPS
50.0000 mg | ORAL_CAPSULE | Freq: Every day | ORAL | Status: DC
Start: 2014-06-28 — End: 2014-09-20
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
## 2014-07-06 ENCOUNTER — Other Ambulatory Visit: Payer: Self-pay | Admitting: Otolaryngology

## 2014-07-06 DIAGNOSIS — R221 Localized swelling, mass and lump, neck: Secondary | ICD-10-CM

## 2014-07-06 DIAGNOSIS — R220 Localized swelling, mass and lump, head: Secondary | ICD-10-CM

## 2014-07-06 DIAGNOSIS — M26609 Unspecified temporomandibular joint disorder, unspecified side: Secondary | ICD-10-CM

## 2014-07-06 DIAGNOSIS — H9209 Otalgia, unspecified ear: Secondary | ICD-10-CM

## 2014-07-11 ENCOUNTER — Ambulatory Visit
Admission: RE | Admit: 2014-07-11 | Discharge: 2014-07-11 | Disposition: A | Discharge status: Home / Self Care | Payer: Medicare HMO | Source: Ambulatory Visit | Attending: Otolaryngology | Admitting: Otolaryngology

## 2014-07-11 DIAGNOSIS — H9209 Otalgia, unspecified ear: Secondary | ICD-10-CM

## 2014-07-11 DIAGNOSIS — R221 Localized swelling, mass and lump, neck: Secondary | ICD-10-CM

## 2014-07-11 DIAGNOSIS — M26609 Unspecified temporomandibular joint disorder, unspecified side: Secondary | ICD-10-CM

## 2014-07-11 DIAGNOSIS — R220 Localized swelling, mass and lump, head: Secondary | ICD-10-CM

## 2014-07-11 MED ORDER — IOHEXOL 300 MG/ML  SOLN
75.0000 mL | Freq: Once | INTRAMUSCULAR | Status: AC | PRN
  Administered 2014-07-11: 75 mL via INTRAVENOUS

## 2014-07-14 ENCOUNTER — Ambulatory Visit (INDEPENDENT_AMBULATORY_CARE_PROVIDER_SITE_OTHER): Payer: Medicare HMO | Admitting: Psychiatry

## 2014-07-14 DIAGNOSIS — F331 Major depressive disorder, recurrent, moderate: Secondary | ICD-10-CM

## 2014-07-14 NOTE — Patient Instructions (Signed)
Discussed orally 

## 2014-07-14 NOTE — Progress Notes (Signed)
   THERAPIST PROGRESS NOTE  Session Time: Friday 07/14/2014 2:10 PM - 3:03 PM  Participation Level: Active  Behavioral Response: CasualAlertAnxious  Type of Therapy: Individual Therapy  Treatment Goals addressed:  Improve assertiveness skills and the ability to set/maintain boundaries  Improve ability to manage stress and anxiety   Interventions: CBT and Supportive  Summary: [name] is a 53 y.o. female who presents with a long standing history of recurrent periods of depression beginning in adolescence. She has had 3 psychiatric hospitalizations due to suicidal attempts by pill overdose with the last one occuring in 2006. She reports beginning to experience suicidal thoughts again last year but deciding to seek professional help rather than overdose. She began seeing Dr. Harrington Challenger in September 2014 and has seen Dr. Sima Matas for therapy. Patient reports improved mood and no longer experiencing suicidal ideations. However, she continues to experience stress related to her physical health and relationship with family. She reports having no energy due to diagnosis of fibromyalgia. She states sometimes being overwhelmed with family members always coming to her for help. She reports being in this role since childhood as she is the oldest of three siblings and provided care for siblings due to mother's behavior.  Patient reports increased health issues for the past month. She is having difficulty with her left ear. She worries that she may lose hearing in that ear and is particularly concerned about this as she is legally blind. Patient is not as worried about father as he hasn't been calling as frequently. However she is experiencing increased anxiety and worry about her younger brother as he is involved in an unhealthy relationship and will not follow patient's advice. She fears something bad may happen to her brother. Patient repors Increased worry about brother also has been triggered by the  upcoming 15 year anniversary of the murder of her other brother.     Suicidal/Homicidal: No  Therapist Response: Therapist works with patient to process feelings, discuss assertive versus being aggressive, identify ways to set/maintain boundaries as well as respecting other's boundaries, examined thought patterns and effects on mood and behavior, identify ways to limit excessive worry ( time limit) and intervene negative spiraling thoughts.  Plan: Return again in 4 weeks.  Diagnosis: Axis I: MDD, Recurrent, Moderate    Axis II: Deferred    BYNUM,[name], LCSW 07/14/2014

## 2014-08-09 ENCOUNTER — Ambulatory Visit (HOSPITAL_COMMUNITY): Payer: Self-pay | Admitting: Psychiatry

## 2014-08-14 ENCOUNTER — Telehealth: Payer: Self-pay | Admitting: Family Medicine

## 2014-08-14 MED ORDER — NYSTATIN 100000 UNIT/ML MT SUSP: 5.0000 mL | Freq: Four times a day (QID) | OROMUCOSAL | Status: DC

## 2014-08-14 NOTE — Telephone Encounter (Signed)
Medication sent to pharmacy. Patient was notified.  

## 2014-08-14 NOTE — Telephone Encounter (Signed)
Patient would like Rx for thrush to [facility] in [place]

## 2014-08-14 NOTE — Telephone Encounter (Signed)
Ok thrush- nystatin swish and swallow , 1 tsp qid 10 days, fu if ongoing

## 2014-08-21 ENCOUNTER — Other Ambulatory Visit (HOSPITAL_COMMUNITY): Payer: Self-pay | Admitting: Psychiatry

## 2014-08-23 ENCOUNTER — Telehealth (HOSPITAL_COMMUNITY): Payer: Self-pay | Admitting: *Deleted

## 2014-08-23 ENCOUNTER — Other Ambulatory Visit (HOSPITAL_COMMUNITY): Payer: Self-pay | Admitting: Psychiatry

## 2014-08-23 MED ORDER — DULOXETINE HCL 60 MG PO CPEP: 60.0000 mg | ORAL_CAPSULE | Freq: Two times a day (BID) | ORAL | Status: DC

## 2014-08-23 NOTE — Telephone Encounter (Signed)
Called pt to inform he that her Cymbalta was sent in to her pharmacy and pt showed understanding.

## 2014-08-23 NOTE — Telephone Encounter (Signed)
Prescription sent

## 2014-08-23 NOTE — Telephone Encounter (Signed)
Pt would like to know if Dr. Harrington Challenger could fill her Cymbalta. Pt was last in for f/u 06-28-14 and was only given 1 month worth of refills. Pt have a scheduled f/u for 09-27-14. [phone].

## 2014-09-08 ENCOUNTER — Ambulatory Visit: Payer: Medicare HMO | Admitting: Family Medicine

## 2014-09-11 ENCOUNTER — Ambulatory Visit (INDEPENDENT_AMBULATORY_CARE_PROVIDER_SITE_OTHER): Payer: Medicare HMO | Admitting: Psychiatry

## 2014-09-11 DIAGNOSIS — F331 Major depressive disorder, recurrent, moderate: Secondary | ICD-10-CM

## 2014-09-11 NOTE — Progress Notes (Signed)
     THERAPIST PROGRESS NOTE  Session Time: Monday 09/11/2014 11:05 AM - 11:50 AM  Participation Level: Active  Behavioral Response: CasualAlert/tearful  Type of Therapy: Individual Therapy  Treatment Goals addressed:  Improve assertiveness skills and the ability to set/maintain boundaries  Improve ability to manage stress and anxiety   Interventions: CBT and Supportive  Summary: Julia [name] is a 53 y.o. female who presents with a long standing history of recurrent periods of depression beginning in adolescence. She has had 3 psychiatric hospitalizations due to suicidal attempts by pill overdose with the last one occuring in 2006. She reports beginning to experience suicidal thoughts again last year but deciding to seek professional help rather than overdose. She began seeing Dr. Harrington Challenger in September 2014 and has seen Dr. Sima Matas for therapy. Patient reports improved mood and no longer experiencing suicidal ideations. However, she continues to experience stress related to her physical health and relationship with family. She reports having no energy due to diagnosis of fibromyalgia. She states sometimes being overwhelmed with family members always coming to her for help. She reports being in this role since childhood as she is the oldest of three siblings and provided care for siblings due to mother's behavior.  Patient is tearful at times in session today but states she is numb and doesn't feel anything. She says she has decided not to care or worry about people and states just wanting to be left alone. She is vague about triggers but says something happened this weekend and not wanting to discuss it because there is nothing that can be done about it. She reports crying the past 3-4 days  but now feeling cried out. She expresses some frustration saying no one really cares about her and that she is the one who always is wrong. Again, she remains vague as to any situation that may have  triggered these thoughts. She denies any suicidal and homicidal ideations.  Patient reports maintaining compliance with medication. She is scheduled to see psychiatrist Dr. Harrington Challenger on 09/27/2014 for medication management.   Suicidal/Homicidal: No  Therapist Response: Therapist works with patient to try to identify and verbalize feelings, identify and challenge thinking errors, discuss depression and effects.   Plan: Return again in 2 weeks. Patient agrees to call this practice, call 911, or have someone take her to the emergency room should symptoms worsen.   Diagnosis: Axis I: MDD, Recurrent, Moderate    Axis II: Deferred    Julia Legere, LCSW 09/11/2014

## 2014-09-11 NOTE — Patient Instructions (Signed)
Discussed orally 

## 2014-09-20 ENCOUNTER — Encounter: Payer: Self-pay | Admitting: Family Medicine

## 2014-09-20 ENCOUNTER — Ambulatory Visit (INDEPENDENT_AMBULATORY_CARE_PROVIDER_SITE_OTHER): Payer: Medicare HMO | Admitting: Family Medicine

## 2014-09-20 VITALS — BP 150/90 | Ht 64.0 in | Wt 193.2 lb

## 2014-09-20 DIAGNOSIS — G8929 Other chronic pain: Secondary | ICD-10-CM

## 2014-09-20 DIAGNOSIS — Z79899 Other long term (current) drug therapy: Secondary | ICD-10-CM

## 2014-09-20 DIAGNOSIS — R079 Chest pain, unspecified: Secondary | ICD-10-CM

## 2014-09-20 DIAGNOSIS — I10 Essential (primary) hypertension: Secondary | ICD-10-CM

## 2014-09-20 DIAGNOSIS — M542 Cervicalgia: Secondary | ICD-10-CM

## 2014-09-20 MED ORDER — METHOCARBAMOL 500 MG PO TABS: 500.0000 mg | ORAL_TABLET | Freq: Three times a day (TID) | ORAL | Status: DC | PRN

## 2014-09-20 MED ORDER — HYDROCODONE-ACETAMINOPHEN 10-325 MG PO TABS: 1.0000 | ORAL_TABLET | ORAL | Status: DC | PRN

## 2014-09-20 MED ORDER — LISINOPRIL 10 MG PO TABS: 10.0000 mg | ORAL_TABLET | Freq: Every day | ORAL | Status: DC

## 2014-09-20 NOTE — Progress Notes (Addendum)
   Subjective:    Patient ID: Julia [name], female    DOB: 1960-11-11, 53 y.o.   MRN: [id number]  HPI Patient is here today for a follow up visit on her depression/med check. Patient is doing very well. She does relate she needs refills on her pain medicine  She also states she used her husband's muscle relaxer and that helped her with muscle spasms in her upper back  Patient states that she is still having heat spells and periods when she becomes excessively thirsty. This has been present for about 2 months now. Patient has been tested in the past for diabetes in his been negative.   Patient is still having the pain in her chest also. No other concerns at this time. Patient denies any chest pressure she states that short pains in the chest on the left side come and go last few minutes at a time and goes away no shortness of breath with it.  At times blood pressure is elevated, this is intermittently when her blood pressure is elevated. She denies any symptoms with it.   Review of Systems Patient relates no chest pressure tightness pain or shortness of breath with activity patient does relate no nausea vomiting diarrhea no fevers no cough    Objective:   Physical Exam  Chest wall sharp chest pains noted intermittently on the left side heart regular pulse normal extremities no edema Neurologic grossly normal upper back moderate tenderness     Assessment & Plan:  Upper back spasms-methocarbamol 500 mg 3 times a day when necessary use sparingly not for frequent use caution drowsiness patient states it does not make her drowsy  Chronic neck pain back pain-3 prescriptions given regarding her hydrocodone follow-up 3 months  HTN subpar control increase lisinopril to 10 mg  Intermittent chest spasms I doubt cardiac disease does not sound cardiac EKG overall looks okay  Hyperlipidemia check lipid liver profile  Because of medications and hypertension check metabolic 7  25 minutes spent  with patient   Addendum-I did review her paper chart. Her EKG back in 2009 had a very similar appearance to the current EKG. This is reassuring. It should be noted that the patient was told that she has ongoing troubles with any chest tightness pressure pain she needs to follow-up

## 2014-09-27 ENCOUNTER — Encounter (HOSPITAL_COMMUNITY): Payer: Self-pay | Admitting: Psychiatry

## 2014-09-27 ENCOUNTER — Ambulatory Visit (INDEPENDENT_AMBULATORY_CARE_PROVIDER_SITE_OTHER): Payer: Medicare HMO | Admitting: Psychiatry

## 2014-09-27 VITALS — BP 186/89 | HR 61 | Ht 64.0 in | Wt 194.6 lb

## 2014-09-27 DIAGNOSIS — F332 Major depressive disorder, recurrent severe without psychotic features: Secondary | ICD-10-CM

## 2014-09-27 DIAGNOSIS — F331 Major depressive disorder, recurrent, moderate: Secondary | ICD-10-CM

## 2014-09-27 MED ORDER — LISDEXAMFETAMINE DIMESYLATE 70 MG PO CAPS: 70.0000 mg | ORAL_CAPSULE | Freq: Every day | ORAL | Status: DC

## 2014-09-27 MED ORDER — DULOXETINE HCL 60 MG PO CPEP: 60.0000 mg | ORAL_CAPSULE | Freq: Two times a day (BID) | ORAL | Status: DC

## 2014-09-27 MED ORDER — GABAPENTIN 600 MG PO TABS: 600.0000 mg | ORAL_TABLET | Freq: Three times a day (TID) | ORAL | Status: DC
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
## 2014-09-29 LAB — HEPATIC FUNCTION PANEL
ALBUMIN: 4.2 g/dL (ref 3.5–5.2)
ALK PHOS: 66 U/L (ref 39–117)
ALT: 25 U/L (ref 0–35)
AST: 24 U/L (ref 0–37)
BILIRUBIN INDIRECT: 0.4 mg/dL (ref 0.2–1.2)
BILIRUBIN TOTAL: 0.5 mg/dL (ref 0.2–1.2)
Bilirubin, Direct: 0.1 mg/dL (ref 0.0–0.3)
Total Protein: 6.9 g/dL (ref 6.0–8.3)

## 2014-09-29 LAB — BASIC METABOLIC PANEL
BUN: 6 mg/dL (ref 6–23)
CO2: 28 mEq/L (ref 19–32)
Calcium: 9.4 mg/dL (ref 8.4–10.5)
Chloride: 102 mEq/L (ref 96–112)
Creat: 0.67 mg/dL (ref 0.50–1.10)
GLUCOSE: 96 mg/dL (ref 70–99)
POTASSIUM: 3.8 meq/L (ref 3.5–5.3)
SODIUM: 140 meq/L (ref 135–145)

## 2014-09-29 LAB — LIPID PANEL
Cholesterol: 192 mg/dL (ref 0–200)
HDL: 49 mg/dL (ref >39)
LDL CALC: 110 mg/dL — AB (ref 0–99)
Total CHOL/HDL Ratio: 3.9 Ratio
Triglycerides: 165 mg/dL — ABNORMAL HIGH (ref <150)
VLDL: 33 mg/dL (ref 0–40)

## 2014-10-03 ENCOUNTER — Encounter: Payer: Self-pay | Admitting: Family Medicine

## 2014-10-04 ENCOUNTER — Ambulatory Visit (INDEPENDENT_AMBULATORY_CARE_PROVIDER_SITE_OTHER): Payer: Medicare HMO | Admitting: Psychiatry

## 2014-10-04 DIAGNOSIS — F331 Major depressive disorder, recurrent, moderate: Secondary | ICD-10-CM
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
## 2014-10-04 NOTE — Patient Instructions (Signed)
Discussed orally 

## 2014-10-05 ENCOUNTER — Encounter: Payer: Self-pay | Admitting: Family Medicine

## 2014-10-20 ENCOUNTER — Ambulatory Visit (INDEPENDENT_AMBULATORY_CARE_PROVIDER_SITE_OTHER): Payer: Medicare HMO | Admitting: Psychiatry

## 2014-10-20 DIAGNOSIS — F331 Major depressive disorder, recurrent, moderate: Secondary | ICD-10-CM

## 2014-10-20 NOTE — Patient Instructions (Signed)
Discussed orally 
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
## 2014-11-10 ENCOUNTER — Ambulatory Visit (HOSPITAL_COMMUNITY): Payer: Self-pay | Admitting: Psychiatry

## 2014-11-23 ENCOUNTER — Ambulatory Visit (INDEPENDENT_AMBULATORY_CARE_PROVIDER_SITE_OTHER): Payer: Medicare HMO | Admitting: Psychiatry

## 2014-11-23 DIAGNOSIS — F331 Major depressive disorder, recurrent, moderate: Secondary | ICD-10-CM

## 2014-11-23 NOTE — Progress Notes (Signed)
        THERAPIST PROGRESS NOTE  Session Time: Thursday 11/23/2014 2:05 PM - 3:00 PM   Participation Level: Active  Behavioral Response: CasualAlert/less anxious  Type of Therapy: Individual Therapy  Treatment Goals addressed:  Improve assertiveness skills and the ability to set/maintain boundaries  Improve ability to manage stress and anxiety   Interventions: CBT and Supportive  Summary: Julia [name] is a 54 y.o. female who presents with a long standing history of recurrent periods of depression beginning in adolescence. She has had 3 psychiatric hospitalizations due to suicidal attempts by pill overdose with the last one occuring in 2006. She reports beginning to experience suicidal thoughts again last year but deciding to seek professional help rather than overdose. She began seeing Dr. Harrington Challenger in September 2014 and has seen Dr. Sima Matas for therapy. Patient reports improved mood and no longer experiencing suicidal ideations. However, she continues to experience stress related to her physical health and relationship with family. She reports having no energy due to diagnosis of fibromyalgia. She states sometimes being overwhelmed with family members always coming to her for help. She reports being in this role since childhood as she is the oldest of three siblings and provided care for siblings due to mother's behavior.  Patient reports continuing to feel better since last session. She enjoyed celebrating her anniversary with her husband. She is pleased with her improved assertiveness skills and ability to set and maintain boundaries in the relationship with her aunt and her brother. She says things are going well but still anticipates the worst and is waiting for the other shoe to drop. She says she has been feeling a little down due to increased pain due to her physicial issues. She still expresses frustration along with loss issues related to her reduced physical functioning. She also  states being hard on self as a result of this.   Suicidal/homicidal: No  Therapist Response: Therapist works with patient review treatment plan, discuss connection between thoughts, mood, and behavior, explore patient's thought patterns, begin to identify triggers of anxiety  Plan: Return again in 3 weeks. Patient agrees to complete anxiety log and bring to next session.  Diagnosis: Axis I: MDD, Recurrent, Moderate    Axis II: Deferred    Deaven Barron, LCSW

## 2014-11-23 NOTE — Patient Instructions (Signed)
Discussed orally 

## 2014-11-28 ENCOUNTER — Encounter (HOSPITAL_COMMUNITY): Payer: Self-pay | Admitting: Psychiatry

## 2014-11-28 ENCOUNTER — Ambulatory Visit (INDEPENDENT_AMBULATORY_CARE_PROVIDER_SITE_OTHER): Payer: Medicare HMO | Admitting: Psychiatry

## 2014-11-28 ENCOUNTER — Encounter: Payer: Self-pay | Admitting: Family Medicine

## 2014-11-28 ENCOUNTER — Ambulatory Visit (INDEPENDENT_AMBULATORY_CARE_PROVIDER_SITE_OTHER): Payer: Medicare HMO | Admitting: Family Medicine

## 2014-11-28 VITALS — BP 176/89 | HR 60 | Ht 64.0 in | Wt 193.0 lb

## 2014-11-28 VITALS — BP 138/86 | Ht 64.0 in | Wt 192.6 lb

## 2014-11-28 DIAGNOSIS — M25551 Pain in right hip: Secondary | ICD-10-CM | POA: Diagnosis not present

## 2014-11-28 DIAGNOSIS — M542 Cervicalgia: Secondary | ICD-10-CM

## 2014-11-28 DIAGNOSIS — G8929 Other chronic pain: Secondary | ICD-10-CM

## 2014-11-28 DIAGNOSIS — F331 Major depressive disorder, recurrent, moderate: Secondary | ICD-10-CM

## 2014-11-28 DIAGNOSIS — I10 Essential (primary) hypertension: Secondary | ICD-10-CM

## 2014-11-28 DIAGNOSIS — F332 Major depressive disorder, recurrent severe without psychotic features: Secondary | ICD-10-CM

## 2014-11-28 MED ORDER — GABAPENTIN 600 MG PO TABS: 600.0000 mg | ORAL_TABLET | Freq: Three times a day (TID) | ORAL | Status: DC

## 2014-11-28 MED ORDER — LISDEXAMFETAMINE DIMESYLATE 70 MG PO CAPS: 70.0000 mg | ORAL_CAPSULE | Freq: Every day | ORAL | Status: DC

## 2014-11-28 MED ORDER — HYDROCODONE-ACETAMINOPHEN 10-325 MG PO TABS: 1.0000 | ORAL_TABLET | ORAL | Status: DC | PRN

## 2014-11-28 MED ORDER — DULOXETINE HCL 60 MG PO CPEP: 60.0000 mg | ORAL_CAPSULE | Freq: Two times a day (BID) | ORAL | Status: DC

## 2014-11-28 MED ORDER — TIZANIDINE HCL 2 MG PO CAPS: 2.0000 mg | ORAL_CAPSULE | Freq: Three times a day (TID) | ORAL | Status: DC

## 2014-11-28 MED ORDER — LISINOPRIL 20 MG PO TABS: 20.0000 mg | ORAL_TABLET | Freq: Every day | ORAL | Status: DC

## 2014-11-28 MED ORDER — TRAZODONE HCL 100 MG PO TABS: 100.0000 mg | ORAL_TABLET | Freq: Every evening | ORAL | Status: DC | PRN

## 2014-11-28 NOTE — Progress Notes (Signed)
   Subjective:    Patient ID: Julia [name], female    DOB: 12/06/1960, 54 y.o.   MRN: [id number]  HPI  Patient arrives for a follow up on blood pressure. Her blood pressure is up today. I checked twice still up she does try to watch her diet she does try take her medicine  She has chronic pain she takes her medicine as prescribed it is for chronic back pain neck pain from previous surgeries plus also just related issues. She denies abusing it. She recently got a prescription filled but she is due for next 3 prescriptions  Review of Systems  Constitutional: Negative for activity change and appetite change.  Gastrointestinal: Negative for vomiting and abdominal pain.  Neurological: Negative for weakness.  Psychiatric/Behavioral: Negative for confusion.       Objective:   Physical Exam  Constitutional: She appears well-nourished. No distress.  HENT:  Head: Normocephalic.  Cardiovascular: Normal rate, regular rhythm and normal heart sounds.   No murmur heard. Pulmonary/Chest: Effort normal and breath sounds normal. No respiratory distress.  Musculoskeletal: She exhibits no edema.  Lymphadenopathy:    She has no cervical adenopathy.  Neurological: She is alert. She exhibits normal muscle tone.  Psychiatric: Her behavior is normal.  Vitals reviewed.   She does have low back pain and right hip pain on examination fairly good range of motion      Assessment & Plan:  Ortho-will refer her to orthopedics in [place] for back pain and hip pain and discomfort  HTN-blood pressure went up I told her it could be related to her ADD medicine double up on lisinopril follow-up within 3-4 weeks  Chronic pain 3 prescriptions given all up again in 3 months time  Psychiatric aspects taking care of by her psychiatrist
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
## 2014-12-14 ENCOUNTER — Ambulatory Visit (HOSPITAL_COMMUNITY): Payer: Self-pay | Admitting: Psychiatry

## 2014-12-21 ENCOUNTER — Other Ambulatory Visit: Payer: Self-pay | Admitting: Family Medicine

## 2015-01-01 ENCOUNTER — Encounter: Payer: Self-pay | Admitting: Family Medicine

## 2015-01-01 ENCOUNTER — Ambulatory Visit (INDEPENDENT_AMBULATORY_CARE_PROVIDER_SITE_OTHER): Payer: Medicare HMO | Admitting: Family Medicine

## 2015-01-01 VITALS — BP 132/88 | Ht 62.5 in | Wt 188.0 lb

## 2015-01-01 DIAGNOSIS — Z1231 Encounter for screening mammogram for malignant neoplasm of breast: Secondary | ICD-10-CM

## 2015-01-01 DIAGNOSIS — Z0000 Encounter for general adult medical examination without abnormal findings: Secondary | ICD-10-CM | POA: Diagnosis not present

## 2015-01-01 MED ORDER — METHOCARBAMOL 500 MG PO TABS: 500.0000 mg | ORAL_TABLET | Freq: Three times a day (TID) | ORAL | Status: AC | PRN

## 2015-01-01 MED ORDER — LISINOPRIL-HYDROCHLOROTHIAZIDE 20-25 MG PO TABS: 1.0000 | ORAL_TABLET | Freq: Every day | ORAL | Status: DC
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
## 2015-01-03 ENCOUNTER — Ambulatory Visit (INDEPENDENT_AMBULATORY_CARE_PROVIDER_SITE_OTHER): Payer: Medicare HMO | Admitting: Psychiatry

## 2015-01-03 DIAGNOSIS — F331 Major depressive disorder, recurrent, moderate: Secondary | ICD-10-CM | POA: Diagnosis not present

## 2015-01-03 NOTE — Patient Instructions (Signed)
Discussed orally 
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
## 2015-01-04 ENCOUNTER — Ambulatory Visit (HOSPITAL_COMMUNITY): Payer: Self-pay

## 2015-01-17 ENCOUNTER — Ambulatory Visit (HOSPITAL_COMMUNITY): Payer: Self-pay

## 2015-02-01 ENCOUNTER — Telehealth (HOSPITAL_COMMUNITY): Payer: Self-pay | Admitting: *Deleted

## 2015-02-05 ENCOUNTER — Telehealth (HOSPITAL_COMMUNITY): Payer: Self-pay | Admitting: *Deleted

## 2015-02-05 ENCOUNTER — Ambulatory Visit (HOSPITAL_COMMUNITY): Payer: Self-pay | Admitting: Psychiatry

## 2015-02-05 NOTE — Telephone Encounter (Signed)
lmtcb due to needing to resch appt due to provider being out of office. number provided

## 2015-02-26 ENCOUNTER — Ambulatory Visit (INDEPENDENT_AMBULATORY_CARE_PROVIDER_SITE_OTHER): Payer: Medicare HMO | Admitting: Psychiatry

## 2015-02-26 ENCOUNTER — Encounter (HOSPITAL_COMMUNITY): Payer: Self-pay | Admitting: Psychiatry

## 2015-02-26 VITALS — BP 130/87 | HR 59 | Ht 62.5 in | Wt 186.2 lb

## 2015-02-26 DIAGNOSIS — F332 Major depressive disorder, recurrent severe without psychotic features: Secondary | ICD-10-CM | POA: Diagnosis not present

## 2015-02-26 DIAGNOSIS — F331 Major depressive disorder, recurrent, moderate: Secondary | ICD-10-CM

## 2015-02-26 MED ORDER — LISDEXAMFETAMINE DIMESYLATE 70 MG PO CAPS: 70.0000 mg | ORAL_CAPSULE | Freq: Every day | ORAL | Status: AC

## 2015-02-26 MED ORDER — DULOXETINE HCL 60 MG PO CPEP: 60.0000 mg | ORAL_CAPSULE | Freq: Two times a day (BID) | ORAL | Status: AC

## 2015-02-26 MED ORDER — LISDEXAMFETAMINE DIMESYLATE 70 MG PO CAPS: 70.0000 mg | ORAL_CAPSULE | Freq: Every day | ORAL | Status: DC

## 2015-02-26 MED ORDER — TRAZODONE HCL 100 MG PO TABS: 200.0000 mg | ORAL_TABLET | Freq: Every evening | ORAL | Status: AC | PRN

## 2015-02-26 MED ORDER — GABAPENTIN 600 MG PO TABS: 600.0000 mg | ORAL_TABLET | Freq: Three times a day (TID) | ORAL | Status: AC
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
## 2015-03-16 ENCOUNTER — Encounter: Payer: Self-pay | Admitting: Family Medicine

## 2015-03-16 ENCOUNTER — Ambulatory Visit (INDEPENDENT_AMBULATORY_CARE_PROVIDER_SITE_OTHER): Payer: Medicare HMO | Admitting: Family Medicine

## 2015-03-16 VITALS — BP 132/88 | Ht 62.5 in | Wt 188.4 lb

## 2015-03-16 DIAGNOSIS — Z7189 Other specified counseling: Secondary | ICD-10-CM | POA: Diagnosis not present

## 2015-03-16 DIAGNOSIS — Z79899 Other long term (current) drug therapy: Secondary | ICD-10-CM

## 2015-03-16 DIAGNOSIS — G8929 Other chronic pain: Secondary | ICD-10-CM | POA: Diagnosis not present

## 2015-03-16 DIAGNOSIS — I10 Essential (primary) hypertension: Secondary | ICD-10-CM | POA: Diagnosis not present

## 2015-03-16 DIAGNOSIS — M542 Cervicalgia: Secondary | ICD-10-CM | POA: Diagnosis not present

## 2015-03-16 MED ORDER — HYDROCODONE-ACETAMINOPHEN 10-325 MG PO TABS: 1.0000 | ORAL_TABLET | ORAL | Status: DC | PRN

## 2015-03-16 MED ORDER — LISINOPRIL-HYDROCHLOROTHIAZIDE 20-12.5 MG PO TABS: 1.0000 | ORAL_TABLET | Freq: Every day | ORAL | Status: AC

## 2015-03-16 NOTE — Patient Instructions (Signed)

## 2015-03-16 NOTE — Progress Notes (Signed)
   Subjective:    Patient ID: Julia [name], female    DOB: 02-Jun-1961, 54 y.o.   MRN: [id number]  HPI This patient was seen today for chronic pain  The medication list was reviewed and updated.   -Compliance with pain medication: yes  The patient was advised the importance of maintaining medication and not using illegal substances with these.  Refills needed: yes  The patient was educated that we can provide 3 monthly scripts for their medication, it is their responsibility to follow the instructions.  Side effects or complications from medications: none  Patient is aware that pain medications are meant to minimize the severity of the pain to allow their pain levels to improve to allow for better function. They are aware of that pain medications cannot totally remove their pain.  Due for UDT ( at least once per year) :   Patient states that she has no concerns at this time.    Patient will be moving to [place]. Will be getting established with physician in that area.  Review of Systems Patient denies chest tightness pressure pain shortness breath nausea vomiting diarrhea.    Objective:   Physical Exam Lungs are clear hearts regular pulse normal patient has severe scarring in her neck where she had surgery and subsequent infection. Patient was subjective discomfort down the arms worse on the right than the left extremities no edema.  Greater than 25 minutes spent with the patient discussing multiple issues    Assessment & Plan:  1. Encounter for chronic pain management Patient relates that she does occasionally use marijuana. She was encouraged not to be using that with the pain medicine but patient states that she uses it occasionally to help her with her discomfort. She denies use of any other drugs.  Patient relates she takes anywhere between 4 and 6 tablets per day to help her with her pain. The pain is primarily in her neck radiating into the arm but to some degree in  her back. She relates the pain medication allows her to function better.patient does take gabapentin. - ToxASSURE Select 13 (MW), Urine  2. Chronic neck pain See above. Pain management contract discuss with the patient contract was signed questions answered patient was informed about the risk of pain medication in the importance of not combining it with other medications or drugs.  3. Essential hypertension Blood pressure under good control today but is on the low when I recommend reducing the strength of her medicine change to Zestoretic 20/12.5  4. High risk medication use She will need a metabolic 7 she will get this done in the near future - Basic metabolic panel

## 2015-03-17 LAB — BASIC METABOLIC PANEL
BUN/Creatinine Ratio: 11 (ref 9–23)
BUN: 8 mg/dL (ref 6–24)
CO2: 29 mmol/L (ref 18–29)
Calcium: 9.5 mg/dL (ref 8.7–10.2)
Chloride: 93 mmol/L — ABNORMAL LOW (ref 97–108)
Creatinine, Ser: 0.72 mg/dL (ref 0.57–1.00)
GFR calc non Af Amer: 94 mL/min/{1.73_m2} (ref >59)
GFR, EST AFRICAN AMERICAN: 108 mL/min/{1.73_m2} (ref >59)
Glucose: 82 mg/dL (ref 65–99)
POTASSIUM: 3.4 mmol/L — AB (ref 3.5–5.2)
Sodium: 141 mmol/L (ref 134–144)

## 2015-03-24 LAB — TOXASSURE SELECT 13 (MW), URINE: PDF: 0

## 2015-04-05 ENCOUNTER — Encounter: Payer: Self-pay | Admitting: Gastroenterology

## 2015-04-05 ENCOUNTER — Other Ambulatory Visit: Payer: Self-pay | Admitting: *Deleted

## 2015-04-05 MED ORDER — HYDROCODONE-ACETAMINOPHEN 10-325 MG PO TABS: 1.0000 | ORAL_TABLET | ORAL | Status: AC | PRN

## 2015-05-08 ENCOUNTER — Telehealth (HOSPITAL_COMMUNITY): Payer: Self-pay | Admitting: *Deleted

## 2015-05-08 NOTE — Telephone Encounter (Signed)
voice message from patient for Julia [name].    She is returning [place] phone call.

## 2015-05-08 NOTE — Telephone Encounter (Signed)
Pt pharmacy called wanting clarification on pt Vyvanse. Per script it stated that pharmacy can fill medication before 04-28-15 and after 04-28-15. Per Abigail Butts, she did not know which one to go by. Informed Shawn, Clinician manager stating what script means is to fill after not before but to ask pharmacy when was the last time pt picked up her medication. Informed Wendy what Clinical manager stated and asked her the question that clinical manager wanted to know and she stated this will be there first time filling pt medication. Per Abigail Butts, pt husband dropped off the medication. Informed pt of what was going on and pt showed understanding. Informed pt that office was going to resch appt but per notes, she's living out of town now.

## 2015-05-10 ENCOUNTER — Telehealth (HOSPITAL_COMMUNITY): Payer: Self-pay | Admitting: *Deleted

## 2015-05-10 NOTE — Telephone Encounter (Signed)
Prior authorization received for Vyvanse. Called [phone] spoke to [place] who gave approval until 05/09/16. Authorization #[id number].

## 2015-05-10 NOTE — Telephone Encounter (Signed)
Spoke with pt and she showed understanding 

## 2015-05-10 NOTE — Telephone Encounter (Signed)
Spoke with pt

## 2015-05-11 NOTE — Telephone Encounter (Signed)
noted 

## 2015-06-03 ENCOUNTER — Other Ambulatory Visit: Payer: Self-pay | Admitting: Family Medicine
